# Patient Record
Sex: Female | Born: 1970 | Race: Black or African American | Hispanic: No | Smoking: Current some day smoker
Health system: Southern US, Community
[De-identification: ages and names within clinical notes are randomized; demographics above are authoritative.]

## PROBLEM LIST (undated history)

## (undated) DIAGNOSIS — E785 Hyperlipidemia, unspecified: Secondary | ICD-10-CM

## (undated) DIAGNOSIS — E559 Vitamin D deficiency, unspecified: Secondary | ICD-10-CM

## (undated) DIAGNOSIS — F32A Depression, unspecified: Secondary | ICD-10-CM

## (undated) DIAGNOSIS — F419 Anxiety disorder, unspecified: Secondary | ICD-10-CM

## (undated) DIAGNOSIS — I4510 Unspecified right bundle-branch block: Secondary | ICD-10-CM

## (undated) HISTORY — DX: Unspecified right bundle-branch block: I45.10

## (undated) HISTORY — DX: Hyperlipidemia, unspecified: E78.5

## (undated) HISTORY — DX: Depression, unspecified: F32.A

## (undated) HISTORY — PX: OTHER SURGICAL HISTORY: SHX169

## (undated) HISTORY — DX: Vitamin D deficiency, unspecified: E55.9

## (undated) HISTORY — PX: ABDOMINAL HYSTERECTOMY: SHX81

## (undated) HISTORY — DX: Anxiety disorder, unspecified: F41.9

---

## 2012-10-25 DIAGNOSIS — J302 Other seasonal allergic rhinitis: Secondary | ICD-10-CM | POA: Insufficient documentation

## 2012-10-25 DIAGNOSIS — G4700 Insomnia, unspecified: Secondary | ICD-10-CM | POA: Insufficient documentation

## 2012-10-25 DIAGNOSIS — M2550 Pain in unspecified joint: Secondary | ICD-10-CM | POA: Insufficient documentation

## 2012-10-25 DIAGNOSIS — R0683 Snoring: Secondary | ICD-10-CM

## 2012-10-25 HISTORY — DX: Other seasonal allergic rhinitis: J30.2

## 2012-10-25 HISTORY — DX: Snoring: R06.83

## 2012-10-25 HISTORY — DX: Insomnia, unspecified: G47.00

## 2012-10-25 HISTORY — DX: Pain in unspecified joint: M25.50

## 2012-12-26 DIAGNOSIS — J329 Chronic sinusitis, unspecified: Secondary | ICD-10-CM

## 2012-12-26 HISTORY — DX: Chronic sinusitis, unspecified: J32.9

## 2013-11-19 DIAGNOSIS — K259 Gastric ulcer, unspecified as acute or chronic, without hemorrhage or perforation: Secondary | ICD-10-CM | POA: Insufficient documentation

## 2013-11-19 HISTORY — DX: Gastric ulcer, unspecified as acute or chronic, without hemorrhage or perforation: K25.9

## 2014-02-25 DIAGNOSIS — K635 Polyp of colon: Secondary | ICD-10-CM | POA: Insufficient documentation

## 2014-02-25 HISTORY — DX: Polyp of colon: K63.5

## 2014-05-22 DIAGNOSIS — Z0000 Encounter for general adult medical examination without abnormal findings: Secondary | ICD-10-CM

## 2014-05-22 HISTORY — DX: Encounter for general adult medical examination without abnormal findings: Z00.00

## 2015-07-02 DIAGNOSIS — Z90710 Acquired absence of both cervix and uterus: Secondary | ICD-10-CM | POA: Insufficient documentation

## 2015-07-02 HISTORY — DX: Acquired absence of both cervix and uterus: Z90.710

## 2021-07-07 LAB — HM DEXA SCAN: HM Dexa Scan: NORMAL

## 2021-10-21 LAB — HM HEPATITIS C SCREENING LAB: HM Hepatitis Screen: NEGATIVE

## 2022-01-10 ENCOUNTER — Ambulatory Visit: Payer: Self-pay | Admitting: Family Medicine

## 2022-01-27 LAB — HM MAMMOGRAPHY

## 2022-02-10 ENCOUNTER — Encounter: Payer: Self-pay | Admitting: *Deleted

## 2022-02-10 ENCOUNTER — Ambulatory Visit (INDEPENDENT_AMBULATORY_CARE_PROVIDER_SITE_OTHER): Payer: BC Managed Care – PPO | Admitting: Family Medicine

## 2022-02-10 ENCOUNTER — Ambulatory Visit (HOSPITAL_BASED_OUTPATIENT_CLINIC_OR_DEPARTMENT_OTHER)
Admission: RE | Admit: 2022-02-10 | Discharge: 2022-02-10 | Disposition: A | Discharge status: Home / Self Care | Payer: BC Managed Care – PPO | Source: Ambulatory Visit | Attending: Family Medicine | Admitting: Family Medicine

## 2022-02-10 ENCOUNTER — Other Ambulatory Visit: Payer: Self-pay

## 2022-02-10 ENCOUNTER — Encounter: Payer: Self-pay | Admitting: Family Medicine

## 2022-02-10 VITALS — BP 123/85 | HR 97 | Ht 66.0 in | Wt 207.8 lb

## 2022-02-10 DIAGNOSIS — E669 Obesity, unspecified: Secondary | ICD-10-CM | POA: Diagnosis not present

## 2022-02-10 DIAGNOSIS — M79645 Pain in left finger(s): Secondary | ICD-10-CM | POA: Insufficient documentation

## 2022-02-10 DIAGNOSIS — Z1211 Encounter for screening for malignant neoplasm of colon: Secondary | ICD-10-CM

## 2022-02-10 DIAGNOSIS — R5383 Other fatigue: Secondary | ICD-10-CM

## 2022-02-10 DIAGNOSIS — E782 Mixed hyperlipidemia: Secondary | ICD-10-CM

## 2022-02-10 DIAGNOSIS — R7303 Prediabetes: Secondary | ICD-10-CM

## 2022-02-10 DIAGNOSIS — Z23 Encounter for immunization: Secondary | ICD-10-CM

## 2022-02-10 DIAGNOSIS — Z6833 Body mass index (BMI) 33.0-33.9, adult: Secondary | ICD-10-CM | POA: Diagnosis not present

## 2022-02-10 LAB — HEMOGLOBIN A1C: Hgb A1c MFr Bld: 5.9 % (ref 4.6–6.5)

## 2022-02-10 LAB — COMPREHENSIVE METABOLIC PANEL
ALT: 18 U/L (ref 0–35)
AST: 21 U/L (ref 0–37)
Albumin: 4.2 g/dL (ref 3.5–5.2)
Alkaline Phosphatase: 49 U/L (ref 39–117)
BUN: 12 mg/dL (ref 6–23)
CO2: 34 mEq/L — ABNORMAL HIGH (ref 19–32)
Calcium: 9.7 mg/dL (ref 8.4–10.5)
Chloride: 103 mEq/L (ref 96–112)
Creatinine, Ser: 0.98 mg/dL (ref 0.40–1.20)
GFR: 66.12 mL/min (ref >60.00)
Glucose, Bld: 84 mg/dL (ref 70–99)
Potassium: 4.8 mEq/L (ref 3.5–5.1)
Sodium: 139 mEq/L (ref 135–145)
Total Bilirubin: 0.4 mg/dL (ref 0.2–1.2)
Total Protein: 7 g/dL (ref 6.0–8.3)

## 2022-02-10 LAB — CBC
HCT: 44.6 % (ref 36.0–46.0)
Hemoglobin: 14.7 g/dL (ref 12.0–15.0)
MCHC: 32.9 g/dL (ref 30.0–36.0)
MCV: 95.6 fl (ref 78.0–100.0)
Platelets: 203 10*3/uL (ref 150.0–400.0)
RBC: 4.67 Mil/uL (ref 3.87–5.11)
RDW: 14 % (ref 11.5–15.5)
WBC: 5.4 10*3/uL (ref 4.0–10.5)

## 2022-02-10 LAB — LIPID PANEL
Cholesterol: 225 mg/dL — ABNORMAL HIGH (ref 0–200)
HDL: 64.1 mg/dL (ref >39.00)
LDL Cholesterol: 135 mg/dL — ABNORMAL HIGH (ref 0–99)
NonHDL: 161.3
Total CHOL/HDL Ratio: 4
Triglycerides: 131 mg/dL (ref 0.0–149.0)
VLDL: 26.2 mg/dL (ref 0.0–40.0)

## 2022-02-10 LAB — TSH: TSH: 2.18 u[IU]/mL (ref 0.35–5.50)

## 2022-02-10 NOTE — Patient Instructions (Signed)
Thank you for choosing [facility] for your Primary Care needs. I am excited for the opportunity to partner with you to meet your health care goals. It was a pleasure meeting you today! ° ° °Information on diet, exercise, and health maintenance recommendations are listed below. This is information to help you be sure you are on track for optimal health and monitoring.  ° °Please look over this and let us know if you have any questions or if you have completed any of the health maintenance outside of [facility] so that we can be sure your records are up to date.  °___________________________________________________________ ° °MyChart:  °For all urgent or time sensitive needs we ask that you please call the office to avoid delays. Our number is [phone]. °MyChart is not constantly monitored and due to the large volume of messages a day, replies may take up to 72 business hours. ° °MyChart Policy: °MyChart allows for you to see your visit notes, after visit summary, provider recommendations, lab and tests results, make an appointment, request refills, and contact your provider or the office for non-urgent questions or concerns. Providers are seeing patients during normal business hours and do not have built in time to review MyChart messages.  °We ask that you allow a minimum of 3 business days for responses to MyChart messages. For this reason, please do not send urgent requests through MyChart. Please call the office at [phone]. °New and ongoing conditions may require a visit. We have virtual and in-person visits available for your convenience.  °Complex MyChart concerns may require a visit. Your provider may request you schedule a virtual or in-person visit to ensure we are providing the best care possible. °MyChart messages sent after 11:00 AM on Friday will not be received by the provider until Monday morning.  °  °Lab and Test Results: °You will receive your lab and  test results on MyChart as soon as they are completed and results have been sent by the lab or testing facility. Due to this service, you will receive your results BEFORE your provider.  °I review lab and test results each morning prior to seeing patients. Some results require collaboration with other providers to ensure you are receiving the most appropriate care. For this reason, we ask that you please allow a minimum of 3-5 business days from the time that ALL results have been received for your provider to receive and review lab and test results and contact you about these.  °Most lab and test result comments from the provider will be sent through MyChart. Your provider may recommend changes to the plan of care, follow-up visits, repeat testing, ask questions, or request an office visit to discuss these results. You may reply directly to this message or call the office to provide information for the provider or set up an appointment. °In some instances, you will be called with test results and recommendations. Please let us know if this is preferred and we will make note of this in your chart to provide this for you.    °If you have not heard a response to your lab or test results in 5 business days from all results returning to MyChart, please call the office to let us know. We ask that you please avoid calling prior to this time unless there is an emergent concern. Due to high call volumes, this can delay the resulting process. ° °After Hours: °For all non-emergency after hours needs,   please call the office at [phone] and select the option to reach the on-call  service. On-call services are shared between multiple [place] offices and therefore it will not be possible to speak directly with your provider. On-call providers may provide medical advice and recommendations, but are unable to provide refills for maintenance medications.  °For all emergency or urgent medical needs after normal business  hours, we recommend that you seek care at the closest Urgent Care or Emergency Department to ensure appropriate treatment in a timely manner.  °[facility] has a 24 hour emergency room located on the ground floor for your convenience.  ° °Urgent Concerns During the Business Day °Providers are seeing patients from 8AM to 5PM with a busy schedule and are most often not able to respond to non-urgent calls until the end of the day or the next business day. °If you should have URGENT concerns during the day, please call and speak to the nurse or schedule a same day appointment so that we can address your concern without delay.  ° °Thank you, again, for choosing me as your health care partner. I appreciate your trust and look forward to learning more about you.  ° °Kiyona Mcnall B. Gwendelyn Lanting, DNP, FNP-C ° °___________________________________________________________ ° °Health Maintenance Recommendations °Screening Testing °Mammogram °Every 1-2 years based on history and risk factors °Starting at age 50 °Pap Smear °Ages 21-39 every 3 years °Ages 30-65 every 5 years with HPV testing °More frequent testing may be required based on results and history °Colon Cancer Screening °Every 1-10 years based on test performed, risk factors, and history °Starting at age 45 °Bone Density Screening °Every 2-10 years based on history °Starting at age 65 for women °Recommendations for men differ based on medication usage, history, and risk factors °AAA Screening °One time ultrasound °Men 65-75 years old who have ever smoked °Lung Cancer Screening °Low Dose Lung CT every 12 months °Age 50-80 years with a 20 pack-year smoking history who still smoke or who have quit within the last 15 years ° °Screening Labs °Routine  Labs: Complete Blood Count (CBC), Complete Metabolic Panel (CMP), Cholesterol (Lipid Panel) °Every 6-12 months based on history and medications °May be recommended more frequently based on current conditions or  previous results °Hemoglobin A1c Lab °Every 3-12 months based on history and previous results °Starting at age 45 or earlier with diagnosis of diabetes, high cholesterol, BMI >26, and/or risk factors °Frequent monitoring for patients with diabetes to ensure blood sugar control °Thyroid Panel (TSH w/ T3 & T4) °Every 6 months based on history, symptoms, and risk factors °May be repeated more often if on medication °HIV °One time testing for all patients 13 and older °May be repeated more frequently for patients with increased risk factors or exposure °Hepatitis C °One time testing for all patients 18 and older °May be repeated more frequently for patients with increased risk factors or exposure °Gonorrhea, Chlamydia °Every 12 months for all sexually active persons 13-24 years °Additional monitoring may be recommended for those who are considered high risk or who have symptoms °PSA °Men 40-54 years old with risk factors °Additional screening may be recommended from age 55-69 based on risk factors, symptoms, and history ° °Vaccine Recommendations °Tetanus Booster °All adults every 10 years °Flu Vaccine °All patients 6 months and older every year °COVID Vaccine °All patients 12 years and older °Initial dosing with booster °May recommend additional booster based on age and health history °HPV Vaccine °2 doses all patients   age 9-26 °Dosing may be considered for patients over 26 °Shingles Vaccine (Shingrix) °2 doses all adults 50 years and older °Pneumonia (Pneumovax 23) °All adults 65 years and older °May recommend earlier dosing based on health history °Pneumonia (Prevnar 13) °All adults 65 years and older °Dosed 1 year after Pneumovax 23 °Pneumonia (Prevnar 20) °All adults 65 years and older (adults 19-64 with certain conditions or risk factors) °1 dose  °For those who have no received Prevnar 13 vaccine previously ° ° °Additional Screening, Testing, and Vaccinations may be recommended on an individualized basis based on  family history, health history, risk factors, and/or exposure.  °__________________________________________________________ ° °Diet Recommendations for All Patients ° °I recommend that all patients maintain a diet low in saturated fats, carbohydrates, and cholesterol. While this can be challenging at first, it is not impossible and small changes can make big differences.  °Things to try: °Decreasing the amount of soda, sweet tea, and/or juice to one or less per day and replace with water °While water is always the first choice, if you do not like water you may consider °adding a water additive without sugar to improve the taste °other sugar free drinks °Replace potatoes with a brightly colored vegetable at dinner °Use healthy oils, such as canola oil or olive oil, instead of butter or hard margarine °Limit your bread intake to two pieces or less a day °Replace regular pasta with low carb pasta options °Bake, broil, or grill foods instead of frying °Monitor portion sizes  °Eat smaller, more frequent meals throughout the day instead of large meals ° °An important thing to remember is, if you love foods that are not great for your health, you don't have to give them up completely. Instead, allow these foods to be a reward when you have done well. Allowing yourself to still have special treats every once in a while is a nice way to tell yourself thank you for working hard to keep yourself healthy.  ° °Also remember that every day is a new day. If you have a bad day and "fall off the wagon", you can still climb right back up and keep moving along on your journey! ° °We have resources available to help you!  °Some websites that may be helpful include: °www.MyPlate.gov  °Www.VeryWellFit.com °_____________________________________________________________ ° °Activity Recommendations for All Patients ° °I recommend that all adults get at least 20 minutes of moderate physical activity that elevates your heart rate at least 5  days out of the week.  °Some examples include: °Walking or jogging at a pace that allows you to carry on a conversation °Cycling (stationary bike or outdoors) °Water aerobics °Yoga °Weight lifting °Dancing °If physical limitations prevent you from putting stress on your joints, exercise in a pool or seated in a chair are excellent options. ° °Do determine your MAXIMUM heart rate for activity: YOUR AGE - 220 = MAX HeartRate  ° °Remember! °Do not push yourself too hard.  °Start slowly and build up your pace, speed, weight, time in exercise, etc.  °Allow your body to rest between exercise and get good sleep. °You will need more water than normal when you are exerting yourself. Do not wait until you are thirsty to drink. Drink with a purpose of getting in at least 8, 8 ounce glasses of water a day plus more depending on how much you exercise and sweat.  ° ° °If you begin to develop dizziness, chest pain, abdominal pain, jaw pain, shortness of breath, headache, vision   changes, lightheadedness, or other concerning symptoms, stop the activity and allow your body to rest. If your symptoms are severe, seek emergency evaluation immediately. If your symptoms are concerning, but not severe, please let us know so that we can recommend further evaluation.  ° ° ° °

## 2022-02-10 NOTE — Progress Notes (Signed)
______________________________________________________________________  HPI Colleen Glass is a 51 y.o. female presenting to [facility] at [facility] today to establish care.   Patient Care Team: Terrilyn Saver, NP as PCP - General (Family Medicine)  Health Maintenance  Topic Date Due   HIV Screening  Never done   Hepatitis C Screening: USPSTF Recommendation to screen - Ages 89-79 yo.  Never done   Colon Cancer Screening  Never done   Mammogram  Never done   COVID-19 Vaccine (3 - Booster for Moderna series) 03/18/2020   Flu Shot  03/17/2022*   Zoster (Shingles) Vaccine (2 of 2) 04/07/2022   Tetanus Vaccine  01/15/2025   HPV Vaccine  Aged Out   Pap Smear  Discontinued  *Topic was postponed. The date shown is not the original due date.     Concerns today: Insomnia/fatigue - has trouble staying asleep, sleep study at [facility] but never got results; snores, does not feel well rested, often tired during the day, never told she is apneic at night, props up on pillows for comfort, occasionally wakes up coughing L thumb pain  - about a month and a half ago, woke up and it was hurting at base of thumb, feels like an injury, doesn't recall an injury , maybe mild swelling, any range of motion is painful, probably 4-6 weeks of symptoms, grip is impaired d/t pain 5/10 consistently, Aleve helping, no known history of arthritis  HLD/RBBB - simvastatin 10 mg daily; Omega-3 daily Weight - strict about diet, regular exercise (walking 3x/week), trouble losing weight ever since menopause. Would like to consider weight loss meds pending lab results   There are no problems to display for this patient.   PHQ9 Today: No flowsheet data found. GAD7 Today: No flowsheet data found. ______________________________________________________________________ PMH Past Medical History:  Diagnosis Date   Hyperlipidemia    Right bundle branch block     ROS All review of systems  negative except what is listed in the HPI  PHYSICAL EXAM Physical Exam Vitals reviewed.  Constitutional:      Appearance: Normal appearance. She is obese.  HENT:     Head: Normocephalic and atraumatic.  Cardiovascular:     Rate and Rhythm: Normal rate and regular rhythm.     Pulses: Normal pulses.     Heart sounds: Normal heart sounds.  Pulmonary:     Effort: Pulmonary effort is normal.     Breath sounds: Normal breath sounds.  Musculoskeletal:     Cervical back: Normal range of motion and neck supple. No tenderness.     Comments: Left thumb pain (MCP to CMP) with palpation and range of motion, no discoloration, warmth, rashes, lesions, significant edema  Lymphadenopathy:     Cervical: No cervical adenopathy.  Skin:    Capillary Refill: Capillary refill takes less than 2 seconds.  Neurological:     General: No focal deficit present.     Mental Status: She is alert and oriented to person, place, and time. Mental status is at baseline.  Psychiatric:        Mood and Affect: Mood normal.        Behavior: Behavior normal.        Thought Content: Thought content normal.        Judgment: Judgment normal.   ______________________________________________________________________ ASSESSMENT AND PLAN  1. Class 1 obesity without serious comorbidity with body mass index (BMI) of 33.0 to 33.9 in adult, unspecified obesity type Interested in weight loss options. Starting  with labs today. Then we discuss options based on results. She has been working hard and eating healthy and getting regular physical activity.  - CBC - Comprehensive metabolic panel - Lipid panel - TSH - Hemoglobin A1c  2. Mixed hyperlipidemia Taking simvastatin and Omega-3. Checking labs today (fasting) - Lipid panel  3. Fatigue, unspecified type Requesting records for sleep study. Labs today to rule out other causes. Discussed sleep hygiene. Consider sleep aids if workups negative.  - CBC - Comprehensive metabolic  panel - TSH  4. Screen for colon cancer - Ambulatory referral to Gastroenterology  5. Pain of left thumb Xray today given duration of symptoms For now, take Aleve bid for 2 weeks then PRN, ice, elevation, compression (sleeve/brace), gentle range of motion exercises.  Consider PT and or sports med if not improving - DG Finger Thumb Left; Future  6. Need for shingles vaccine - Varicella-zoster vaccine IM (Shingrix)  Establish care  Education provided today during visit and on AVS for patient to review at home.  Diet and Exercise recommendations provided.  Current diagnoses and recommendations discussed. HM recommendations reviewed with recommendations.    Outpatient Encounter Medications as of 02/10/2022  Medication Sig   Omega-3 Fatty Acids (FISH OIL) 1000 MG CAPS Take 1 capsule by mouth daily.   simvastatin (ZOCOR) 10 MG tablet Take 10 mg by mouth at bedtime.   Vitamin D, Ergocalciferol, (DRISDOL) 1.25 MG (50000 UNIT) CAPS capsule Take 50,000 Units by mouth once a week.   No facility-administered encounter medications on file as of 02/10/2022.    Return in about 6 months (around 08/10/2022).    Purcell Nails Olevia Bowens, DNP, FNP-C

## 2022-02-13 ENCOUNTER — Telehealth: Payer: Self-pay

## 2022-02-13 MED ORDER — WEGOVY 0.25 MG/0.5ML ~~LOC~~ SOAJ: 0.2500 mg | SUBCUTANEOUS | 0 refills | Status: DC

## 2022-02-13 NOTE — Addendum Note (Signed)
Addended by: Hyman Hopes B on: 02/13/2022 08:15 AM   Modules accepted: Orders

## 2022-02-13 NOTE — Telephone Encounter (Signed)
PA approved. Effective 02/13/2022 - 09/13/2022

## 2022-02-13 NOTE — Telephone Encounter (Signed)
PA initiated via Covermymeds; KEY: [id number]. Awaiting determination.

## 2022-02-26 ENCOUNTER — Encounter: Payer: Self-pay | Admitting: Family Medicine

## 2022-03-03 ENCOUNTER — Telehealth (INDEPENDENT_AMBULATORY_CARE_PROVIDER_SITE_OTHER): Payer: BC Managed Care – PPO | Admitting: Family Medicine

## 2022-03-03 ENCOUNTER — Telehealth: Payer: Self-pay | Admitting: Family Medicine

## 2022-03-03 ENCOUNTER — Encounter: Payer: Self-pay | Admitting: *Deleted

## 2022-03-03 ENCOUNTER — Encounter: Payer: Self-pay | Admitting: Family Medicine

## 2022-03-03 VITALS — Wt 206.0 lb

## 2022-03-03 DIAGNOSIS — G4733 Obstructive sleep apnea (adult) (pediatric): Secondary | ICD-10-CM

## 2022-03-03 DIAGNOSIS — F419 Anxiety disorder, unspecified: Secondary | ICD-10-CM

## 2022-03-03 DIAGNOSIS — F32A Depression, unspecified: Secondary | ICD-10-CM

## 2022-03-03 HISTORY — DX: Obstructive sleep apnea (adult) (pediatric): G47.33

## 2022-03-03 MED ORDER — VENLAFAXINE HCL ER 37.5 MG PO CP24
37.5000 mg | ORAL_CAPSULE | Freq: Every day | ORAL | 1 refills | Status: DC
Start: 2022-03-03 — End: 2022-03-27

## 2022-03-03 NOTE — Telephone Encounter (Signed)
Records received from [facility] regarding Home Sleep Study from 10/14/21. Copy of results will be sent to scanning to add to chart.  ? ?Results indicated overall AHI of 14.9 suggesting mild sleep apnea. Average O2 sat was 94% with lowest sat at 88%. Mean heart rate was 80.6 bpm. Will place referral to pulmonology so patient can discuss treatment options. ?

## 2022-03-03 NOTE — Progress Notes (Signed)
Virtual Video Visit via MyChart Note ? ?I connected with  Colleen Glass on 03/03/22 at 10:20 AM EDT by the video enabled telemedicine application for MyChart, and verified that I am speaking with the correct person using two identifiers. ?  ?I introduced myself as a Publishing rights manager with the practice. We discussed the limitations of evaluation and management by telemedicine and the availability of in person appointments. The patient expressed understanding and agreed to proceed. ? ?Participating parties in this visit include: The patient and the nurse practitioner listed.  ?The patient is: At home ?I am: In the office - [facility] ? ?Subjective:   ? ?CC: depression  ? ? ?HPI: Colleen Glass is a 51 y.o. year old female presenting today via MyChart today for depression. ? ?Patient states she forgot to mention at last appointment that she is struggled with depression in the past.  States many years ago she was on Paxil temporarily after having a stillbirth.  She tolerated it okay but felt it made her feel in different.  Later on she ended up trying Wellbutrin which she stayed on for 2 to 3 years as it worked well at first but then she was not noticing any improvement even after changing doses.  She was then switched to Lexapro which made her feel even more sad/emotional.  She stopped the Lexapro a few months ago.  Reports her mother also has depression but she is unsure of what she takes.  Patient declines any recent, recurring panic/anxiety attacks.  No SI/HI.  She would like to try a new medication and go to counseling. ? ?At last visit we briefly discussed her trouble sleeping.  States she has not gotten relief with trazodone or Ambien.  She usually falls asleep okay but wakes up a few hours into the night and then has trouble going back to sleep.  She had a sleep study done at [facility] recently and we have requested records.  Will be on the look out for these before making  any further decisions. ? ? ? ?PHQ9 SCORE ONLY 03/03/2022  ?PHQ-9 Total Score 12  ? ? ?GAD 7 : Generalized Anxiety Score 03/03/2022  ?Nervous, Anxious, on Edge 1  ?Control/stop worrying 3  ?Worry too much - different things 3  ?Trouble relaxing 1  ?Restless 0  ?Easily annoyed or irritable 3  ?Afraid - awful might happen 1  ?Total GAD 7 Score 12  ?Anxiety Difficulty Very difficult  ? ? ? ? ?Past medical history, Surgical history, Family history not pertinant except as noted below, Social history, Allergies, and medications have been entered into the medical record, reviewed, and corrections made.  ? ?Review of Systems:  ?All review of systems negative except what is listed in the HPI ? ? ?Objective:   ? ?General:  ?Speaking clearly in complete sentences. ?Absent shortness of breath noted.   ?Alert and oriented x3.   ?Normal judgment.  ?Absent acute distress. ? ? ?Impression and Recommendations:   ? ?1. Anxiety and depression ?- venlafaxine XR (EFFEXOR XR) 37.5 MG 24 hr capsule; Take 1 capsule (37.5 mg total) by mouth daily with breakfast.  Dispense: 30 capsule; Refill: 1 ?- Ambulatory referral to Behavioral Health ? ?Discussed options with patient.  She would like to try Effexor as she has heard good things about this medication.  Education discussed and provided via AVS on MyChart.  No SI/HI.  Recommend follow-up in the next 4 to 6 weeks.  Referral placed  for counseling. ? ? ?Follow-up in 4-6 weeks; sooner if symptoms worsen or fail to improve.  ?  ?I discussed the assessment and treatment plan with the patient. The patient was provided an opportunity to ask questions and all were answered. The patient agreed with the plan and demonstrated an understanding of the instructions. ?  ?The patient was advised to call back or seek an in-person evaluation if the symptoms worsen or if the condition fails to improve as anticipated. ? ?I spent 20 minutes dedicated to the care of this patient on the date of this encounter to  include pre-visit chart review of prior notes and results, face-to-face time with the patient, and post-visit ordering of testing as indicated.  ? ?Clayborne Dana, NP  ? ?

## 2022-03-03 NOTE — Telephone Encounter (Signed)
Sent mychart message to pt. 

## 2022-03-03 NOTE — Patient Instructions (Signed)
Taking the medicine as directed and not missing any doses is one of the best things you can do to treat your anxiety/depression.  Here are some things to keep in mind: Side effects (stomach upset, some increased anxiety) may happen before you notice a benefit.  These side effects typically go away over time. Changes to your dose of medicine or a change in medication all together is sometimes necessary Many people will notice an improvement within two weeks but the full effect of the medication can take up to 4-6 weeks Stopping the medication when you start feeling better often results in a return of symptoms. Most people need to be on medication at least 6-12 months If you start having thoughts of hurting yourself or others after starting this medicine, please call me immediately.    

## 2022-03-26 ENCOUNTER — Encounter: Payer: Self-pay | Admitting: Family Medicine

## 2022-03-26 ENCOUNTER — Other Ambulatory Visit: Payer: Self-pay | Admitting: Family Medicine

## 2022-03-26 DIAGNOSIS — F419 Anxiety disorder, unspecified: Secondary | ICD-10-CM

## 2022-03-27 ENCOUNTER — Encounter: Payer: Self-pay | Admitting: Family Medicine

## 2022-03-27 ENCOUNTER — Other Ambulatory Visit: Payer: Self-pay | Admitting: Family Medicine

## 2022-03-27 ENCOUNTER — Other Ambulatory Visit: Payer: Self-pay

## 2022-03-27 MED ORDER — VITAMIN D (ERGOCALCIFEROL) 1.25 MG (50000 UNIT) PO CAPS
50000.0000 [IU] | ORAL_CAPSULE | ORAL | 0 refills | Status: DC
  Filled 2022-03-27: qty 5, 35d supply, fill #0

## 2022-03-28 ENCOUNTER — Other Ambulatory Visit: Payer: Self-pay

## 2022-03-30 ENCOUNTER — Encounter: Payer: Self-pay | Admitting: Family Medicine

## 2022-03-31 ENCOUNTER — Ambulatory Visit (INDEPENDENT_AMBULATORY_CARE_PROVIDER_SITE_OTHER): Payer: BC Managed Care – PPO | Admitting: Psychology

## 2022-03-31 ENCOUNTER — Other Ambulatory Visit: Payer: Self-pay

## 2022-03-31 ENCOUNTER — Encounter: Payer: Self-pay | Admitting: Family Medicine

## 2022-03-31 DIAGNOSIS — F4321 Adjustment disorder with depressed mood: Secondary | ICD-10-CM | POA: Diagnosis not present

## 2022-03-31 MED ORDER — VITAMIN D (ERGOCALCIFEROL) 1.25 MG (50000 UNIT) PO CAPS: 50000.0000 [IU] | ORAL_CAPSULE | ORAL | 3 refills | Status: DC
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
## 2022-04-06 ENCOUNTER — Other Ambulatory Visit: Payer: Self-pay

## 2022-04-06 ENCOUNTER — Encounter: Payer: Self-pay | Admitting: Family Medicine

## 2022-04-06 DIAGNOSIS — R7303 Prediabetes: Secondary | ICD-10-CM

## 2022-04-06 DIAGNOSIS — E669 Obesity, unspecified: Secondary | ICD-10-CM

## 2022-04-06 MED ORDER — WEGOVY 0.25 MG/0.5ML ~~LOC~~ SOAJ: 0.2500 mg | SUBCUTANEOUS | 0 refills | Status: DC

## 2022-04-06 MED ORDER — VITAMIN D (ERGOCALCIFEROL) 1.25 MG (50000 UNIT) PO CAPS: 50000.0000 [IU] | ORAL_CAPSULE | ORAL | 3 refills | Status: DC

## 2022-04-14 ENCOUNTER — Encounter: Payer: Self-pay | Admitting: Family Medicine

## 2022-04-14 ENCOUNTER — Ambulatory Visit (INDEPENDENT_AMBULATORY_CARE_PROVIDER_SITE_OTHER): Payer: BC Managed Care – PPO | Admitting: Psychology

## 2022-04-14 DIAGNOSIS — F4321 Adjustment disorder with depressed mood: Secondary | ICD-10-CM | POA: Diagnosis not present

## 2022-04-14 NOTE — Progress Notes (Signed)
[facility] Counselor/Therapist Progress Note ? ?Patient ID: Colleen Glass, MRN: [id number],   ? ?Date: 04/14/2022 ? ?Time Spent: 45 mins ? ?Treatment Type: Individual Therapy ? ?Reported Symptoms: Pt presents for session, via telephone, due to webex issues.  Pt grants consent for the session, stating that she is in her home with no one else present.  I shared with pt that I am in my office at home and no one is here with me either.   ? ?Mental Status Exam: ?Appearance:  Casual     ?Behavior: Appropriate  ?Motor: Normal  ?Speech/Language:  Clear and Coherent  ?Affect: Congruent  ?Mood: normal  ?Thought process: normal  ?Thought content:   WNL  ?Sensory/Perceptual disturbances:   WNL  ?Orientation: oriented to person, place, and time/date  ?Attention: Good  ?Concentration: Good  ?Memory: WNL  ?Fund of knowledge:  Good  ?Insight:   Good  ?Judgment:  Good  ?Impulse Control: Good  ? ?Risk Assessment: ?Danger to Self:  No ?Self-injurious Behavior: No ?Danger to Others: No ?Duty to Warn:no ?Physical Aggression / Violence:No  ?Access to Firearms a concern: No  ?Gang Involvement:No  ? ?Subjective: Talked with pt about the benefits of self care activities; she has not been very good about being consistent with focusing doing what is good for her.  Pt shares she also has some issues with ruminating thoughts and not being able to focus of her work or self care activities (concerns for her daughter, etc).  Pt is often concerned about Adelina Mings and these thoughts often are the focus of her ruminations.  Encouraged pt to talk with Adelina Mings about how they can come up with a plan that works for both of them to stay in contact at a level that is helpful for each of them.  Also encouraged pt to think about when she wants to engage in her self care activities and what she wants them to be.  Also suggested pt contact her PCP about a possible increase in the dosage of her antidepressant for greater impact.  We will talk  more about these topics in our follow up session in 2 wks. ? ?Interventions: Cognitive Behavioral Therapy ? ?Diagnosis:Adjustment disorder with depressed mood ? ?Plan: Treatment Plan ?Strengths/Abilities:  Intelligent, Intuitive, Willing to participate in therapy ?Treatment Preferences:  Outpatient Individual Therapy ?Statement of Needs:  Patient is to use CBT, mindfulness and coping skills to help manage and/or decrease symptoms associated with their diagnosis. ?Symptoms:  Depressed/Irritable mood, worry, social withdrawal ?Problems Addressed:  Depressive thoughts, Sadness, Sleep issues, etc. ?Long Term Goals:  Pt to reduce overall level, frequency, and intensity of the feelings of depression as evidenced by decreased irritability, negative self talk, and helpless feelings from 6 to 7 days/week to 0 to 1 days/week, per client report, for at least 3 consecutive months.  Progress: 10% ?Short Term Goals:  Pt to verbally express understanding of the relationship between feelings of depression and their impact on thinking patterns and behaviors.  Pt to verbalize an understanding of the role that distorted thinking plays in creating fears, excessive worry, and ruminations.  Progress: 10% ?Target Date:  04/15/2023 ?Frequency:  Bi-weekly ?Modality:  Cognitive Behavioral Therapy ?Interventions by Therapist:  Therapist will use CBT, Mindfulness exercises, Coping skills and Referrals, as needed by client. ?Client has verbally approved this treatment plan. ? ?Karie Kirks, [facility] ?

## 2022-04-21 ENCOUNTER — Ambulatory Visit (INDEPENDENT_AMBULATORY_CARE_PROVIDER_SITE_OTHER): Payer: BC Managed Care – PPO | Admitting: Acute Care

## 2022-04-21 ENCOUNTER — Encounter: Payer: Self-pay | Admitting: Acute Care

## 2022-04-21 VITALS — BP 120/78 | HR 96 | Temp 98.4°F | Ht 66.0 in | Wt 205.6 lb

## 2022-04-21 DIAGNOSIS — G4733 Obstructive sleep apnea (adult) (pediatric): Secondary | ICD-10-CM

## 2022-04-21 NOTE — Progress Notes (Signed)
? ?History of Present Illness ?Colleen Glass is a 51 y.o. female with PMH of Hyperlipidemia, and partial hysterectomy in 2009. She presents for suspected OSA  for sleep consult. She had a recent sleep study at [facility] which confirms mild OSA,. Referred by Colleen Hopes NP. ? ? ?04/21/2022 ?Pt. Presents for evaluation of sleep apnea. She states she has had Weight gain since menopause. She has daytime sleepiness, morning headaches, night time awakenings. She is frustrated by how difficult it has been to lose weight. She knows this is an element of her apnea.  ? ?Test Results: ?10/14/2021 Home Sleep Study ?AHI of 14.9 suggesting mild sleep apnea.  ?Average O2 sat was 94% with lowest sat at 88%.  ?Mean heart rate was 80.6 bpm. ? ?Sleep questionnaire ?Symptoms-  Patient has sleep apnea, no current symptoms  ?Prior sleep study-  Yes, 10/2021 ?Bedtime- 10-11 pm ?Time to fall asleep- 1 hour ?Nocturnal awakenings- 2-3 ?Out of bed/start of day- 6 am ?Weight changes- increased since menopause ?Do you operate heavy machinery- No ?Do you currently wear CPAP- No ?Do you current wear oxygen- No ?Epworth-  5 ?Moderate Chance of dozing sitting and reading/ watching TV( 4) ?Slight chance of dosing as passenger in car for an hour without a break ( 1)  ? ? ?  Latest Ref Rng & Units 02/10/2022  ? 10:41 AM  ?CBC  ?WBC 4.0 - 10.5 K/uL 5.4    ?Hemoglobin 12.0 - 15.0 g/dL 74.2    ?Hematocrit 36.0 - 46.0 % 44.6    ?Platelets 150.0 - 400.0 K/uL 203.0    ? ? ? ?  Latest Ref Rng & Units 02/10/2022  ? 10:41 AM  ?BMP  ?Glucose 70 - 99 mg/dL 84    ?BUN 6 - 23 mg/dL 12    ?Creatinine 0.40 - 1.20 mg/dL 5.95    ?Sodium 135 - 145 mEq/L 139    ?Potassium 3.5 - 5.1 mEq/L 4.8    ?Chloride 96 - 112 mEq/L 103    ?CO2 19 - 32 mEq/L 34    ?Calcium 8.4 - 10.5 mg/dL 9.7    ? ? ?BNP ?No results found for: BNP ? ?ProBNP ?No results found for: PROBNP ? ?PFT ?No results found for: FEV1PRE, FEV1POST, FVCPRE, FVCPOST, TLC, DLCOUNC, PREFEV1FVCRT,  PSTFEV1FVCRT ? ?No results found. ? ? ?Past medical hx ?Past Medical History:  ?Diagnosis Date  ? Anxiety and depression   ? Hyperlipidemia   ? Right bundle branch block   ? Vitamin D deficiency   ?  ? ?Social History  ? ?Tobacco Use  ? Smoking status: Some Days  ?  Types: Cigarettes  ? Smokeless tobacco: Never  ?Vaping Use  ? Vaping Use: Never used  ?Substance Use Topics  ? Alcohol use: Yes  ? Drug use: Yes  ?  Types: Other-see comments  ?  Comment: CBD gummies to help with sleep  ? ? ?Colleen Glass reports that she has been smoking cigarettes. She has never used smokeless tobacco. She reports current alcohol use. She reports current drug use. Drug: Other-see comments. ? ?Tobacco Cessation: ?Current every day smoker ? ?Past surgical hx, Family hx, Social hx all reviewed. ? ?Current Outpatient Medications on File Prior to Visit  ?Medication Sig  ? Omega-3 Fatty Acids (FISH OIL) 1000 MG CAPS Take 1 capsule by mouth daily.  ? Semaglutide-Weight Management (WEGOVY) 0.25 MG/0.5ML SOAJ Inject 0.25 mg into the skin once a week.  ? simvastatin (ZOCOR) 10 MG tablet Take 10 mg by  mouth at bedtime.  ? venlafaxine XR (EFFEXOR-XR) 37.5 MG 24 hr capsule TAKE 1 CAPSULE BY MOUTH DAILY WITH BREAKFAST.  ? Vitamin D, Ergocalciferol, (DRISDOL) 1.25 MG (50000 UNIT) CAPS capsule Take 1 capsule (50,000 Units total) by mouth once a week. Schedule labs before next refill.  ? ?No current facility-administered medications on file prior to visit.  ?  ? ?Allergies  ?Allergen Reactions  ? Penicillins Rash  ? Sulfa Antibiotics Hives and Rash  ? ? ?Review Of Systems: ? ?Constitutional:   No  weight loss, night sweats,  Fevers, chills, fatigue, or  lassitude. ? ?HEENT:   No headaches,  Difficulty swallowing,  Tooth/dental problems, or  Sore throat,  ?              No sneezing, itching, ear ache, nasal congestion, post nasal drip,  ? ?CV:  No chest pain,  Orthopnea, PND, swelling in lower extremities, anasarca, dizziness, palpitations, syncope.   ? ?GI  No heartburn, indigestion, abdominal pain, nausea, vomiting, diarrhea, change in bowel habits, loss of appetite, bloody stools.  ? ?Resp: No shortness of breath with exertion or at rest.  No excess mucus, no productive cough,  No non-productive cough,  No coughing up of blood.  No change in color of mucus.  No wheezing.  No chest wall deformity ? ?Skin: no rash or lesions. ? ?GU: no dysuria, change in color of urine, no urgency or frequency.  No flank pain, no hematuria  ? ?MS:  No joint pain or swelling.  No decreased range of motion.  No back pain. ? ?Psych:  No change in mood or affect. No depression or anxiety.  No memory loss. ? ? ?Vital Signs ?BP 120/78 (BP Location: Left Arm, Patient Position: Sitting, Cuff Size: Normal)   Pulse 96   Temp 98.4 ?F (36.9 ?C) (Oral)   Ht 5\' 6"  (1.676 m)   Wt 205 lb 9.6 oz (93.3 kg)   SpO2 100%   BMI 33.18 kg/m?  ? ? ?Physical Exam: ? ?General- No distress,  A&Ox3, pleasant  ?ENT: No sinus tenderness, TM clear, pale nasal mucosa, no oral exudate,no post nasal drip, no LAN ?Cardiac: S1, S2, regular rate and rhythm, no murmur ?Chest: No wheeze/ rales/ dullness; no accessory muscle use, no nasal flaring, no sternal retractions ?Abd.: Soft Non-tender, ND, BS +, Body mass index is 33.18 kg/m?. ?Ext: No clubbing cyanosis, edema ?Neuro:  normal strength, MAE x 4, A&O x 3 ?Skin: No rashes, warm and dry, No lesions  ?Psych: normal mood and behavior ? ? ?Assessment/Plan ?Mild OSA per Sleep Study 10/2021 ?Plan ?We will place orders for a CPAP machine. ?Start pressures Auto set 5-15 cm H2O ?Dream ware nasal pillow ?Consider body pillow to help maintain side lying position ?Continue on CPAP at bedtime. You appear to be benefiting from the treatment  ?Goal is to wear for at least 6 hours each night for maximal clinical benefit. ?Continue to work on weight loss, as the link between excess weight  and sleep apnea is well established.   ?Remember to establish a good bedtime routine,  and work on sleep hygiene.  ?Limit daytime naps , avoid stimulants such as caffeine and nicotine close to bedtime, exercise daily to promote sleep quality, avoid heavy , spicy, fried , or rich foods before bed. Ensure adequate exposure to natural light during the day,establish a relaxing bedtime routine with a pleasant sleep environment ( Bedroom between 60 and 67 degrees, turn off bright lights , TV  or device screens screens , consider black out curtains or white noise machines) ?Do not drive if sleepy. ?Remember to clean mask, tubing, filter, and reservoir once weekly with soapy water.  ?Follow up with  Piercen Covino NP  In  8 weeks  ?We will check a down Load    ?Please contact office for sooner follow up if symptoms do not improve or worsen or seek emergency care   ? ? ? ?I spent 30 minutes dedicated to the care of this patient on the date of this encounter to include pre-visit review of records, face-to-face time with the patient discussing conditions above, post visit ordering of testing, clinical documentation with the electronic health record, making appropriate referrals as documented, and communicating necessary information to the patient's healthcare team.  ? ?Bevelyn NgoSarah F Kiam Bransfield, NP ?04/21/2022  5:11 PM ? ? ? ? ? ? ? ? ? ?

## 2022-04-21 NOTE — Patient Instructions (Addendum)
It is good to see you today ?We will place orders for a CPAP machine. ?Start pressures Auto set 5-15 cm H2O ?Dream ware nasal pillow ?Consider body pillow to help maintain side lying position ?Continue on CPAP at bedtime. You appear to be benefiting from the treatment  ?Goal is to wear for at least 6 hours each night for maximal clinical benefit. ?Continue to work on weight loss, as the link between excess weight  and sleep apnea is well established.   ?Remember to establish a good bedtime routine, and work on sleep hygiene.  ?Limit daytime naps , avoid stimulants such as caffeine and nicotine close to bedtime, exercise daily to promote sleep quality, avoid heavy , spicy, fried , or rich foods before bed. Ensure adequate exposure to natural light during the day,establish a relaxing bedtime routine with a pleasant sleep environment ( Bedroom between 60 and 67 degrees, turn off bright lights , TV or device screens screens , consider black out curtains or white noise machines) ?Do not drive if sleepy. ?Remember to clean mask, tubing, filter, and reservoir once weekly with soapy water.  ?Follow up with  Colleen Mcbeth NP  In  8 weeks  ?We will check a down Load    ?Please contact office for sooner follow up if symptoms do not improve or worsen or seek emergency care   ? ?

## 2022-04-24 NOTE — Progress Notes (Signed)
Reviewed and agree with assessment/plan. ? ? ?Kamyra Schroeck, MD ?[facility] ?04/24/2022, 8:28 AM ?Pager:  [phone] ? ?

## 2022-04-28 ENCOUNTER — Ambulatory Visit: Payer: BC Managed Care – PPO | Admitting: Psychology

## 2022-05-04 ENCOUNTER — Encounter: Payer: Self-pay | Admitting: Family Medicine

## 2022-05-18 ENCOUNTER — Telehealth: Payer: Self-pay | Admitting: Gastroenterology

## 2022-05-18 NOTE — Telephone Encounter (Signed)
Good Afternoon Dr. Silverio Decamp,   We have received records and referral for patient to have a colonoscopy. After looking at her records patient had her last procedure done with [facility] in 2020. I will be sending records for you to review, will you please review and advise on scheduling?  Thank you.

## 2022-06-02 ENCOUNTER — Encounter: Payer: Self-pay | Admitting: Family Medicine

## 2022-06-02 NOTE — Telephone Encounter (Signed)
Per Dr Lavon Paganini Colonoscopy recall will be 08/2022 Records sent to be scanned in

## 2022-06-16 ENCOUNTER — Encounter: Payer: Self-pay | Admitting: Acute Care

## 2022-06-16 ENCOUNTER — Ambulatory Visit (INDEPENDENT_AMBULATORY_CARE_PROVIDER_SITE_OTHER): Payer: BC Managed Care – PPO | Admitting: Acute Care

## 2022-06-16 ENCOUNTER — Telehealth: Payer: Self-pay | Admitting: *Deleted

## 2022-06-16 VITALS — BP 106/80 | HR 83 | Temp 98.4°F | Ht 66.0 in | Wt 203.0 lb

## 2022-06-16 DIAGNOSIS — F17210 Nicotine dependence, cigarettes, uncomplicated: Secondary | ICD-10-CM | POA: Diagnosis not present

## 2022-06-16 DIAGNOSIS — G4733 Obstructive sleep apnea (adult) (pediatric): Secondary | ICD-10-CM | POA: Diagnosis not present

## 2022-06-16 DIAGNOSIS — Z9989 Dependence on other enabling machines and devices: Secondary | ICD-10-CM

## 2022-06-16 DIAGNOSIS — F17200 Nicotine dependence, unspecified, uncomplicated: Secondary | ICD-10-CM

## 2022-06-16 NOTE — Telephone Encounter (Signed)
Corrie Dandy states can see patient's download now. Mary phone number is [phone].

## 2022-06-16 NOTE — Progress Notes (Signed)
History of Present Illness Colleen Glass is a 51 y.o. female with PMH of Hyperlipidemia, and partial hysterectomy in 2009. She presents for suspected OSA  for sleep consult. She had a recent sleep study at [facility] which confirms mild OSA.She started treatment with CPAP for her OSA in 03/2022.    06/16/2022 Pt. Was seen 04/21/2022 for a sleep consult. She had had a sleep study that confirmed Mild sleep apnea with AHI of 14.9. She was symptomatic for daytime sleepiness, morning headaches, night time awakenings.She does use CBD gummies to help her with sleep.  We reviewed options for treatment, and she decided on CPAP therapy with nasal pillow. She is here for follow up after getting her CPAP machine to see how she is doing with new therapy initiation. . Initial mode and pressures are Auto Set 5-15 cm H2O. Down Load reveals   Pt. States since she has been wearing her CPAP machine she has felt much better. She has been using he CPAP machine for about 3 months. She does not have night time awakenings. She has much less sleepy during the day. She no longer has morning headaches. She is sleeping 5-6 hours   Test Results: Down Load AutoSet 5-15 cm pressure Usage 30/30 days Average usage 8 hours 36 minutes > 4 hours 100% of the time AHI 0.9  Average Pressure 6.1 mm H2O Minimal leaks  10/14/2021 Home Sleep Study AHI of 14.9 suggesting mild sleep apnea.  Average O2 sat was 94% with lowest sat at 88%.  Mean heart rate was 80.6 bpm.     Latest Ref Rng & Units 02/10/2022   10:41 AM  CBC  WBC 4.0 - 10.5 K/uL 5.4   Hemoglobin 12.0 - 15.0 g/dL 70.3   Hematocrit 50.0 - 46.0 % 44.6   Platelets 150.0 - 400.0 K/uL 203.0        Latest Ref Rng & Units 02/10/2022   10:41 AM  BMP  Glucose 70 - 99 mg/dL 84   BUN 6 - 23 mg/dL 12   Creatinine 9.38 - 1.20 mg/dL 1.82   Sodium 993 - 716 mEq/L 139   Potassium 3.5 - 5.1 mEq/L 4.8   Chloride 96 - 112 mEq/L 103   CO2 19 - 32 mEq/L 34   Calcium 8.4  - 10.5 mg/dL 9.7     BNP No results found for: "BNP"  ProBNP No results found for: "PROBNP"  PFT No results found for: "FEV1PRE", "FEV1POST", "FVCPRE", "FVCPOST", "TLC", "DLCOUNC", "PREFEV1FVCRT", "PSTFEV1FVCRT"  No results found.   Past medical hx Past Medical History:  Diagnosis Date   Anxiety and depression    Hyperlipidemia    Right bundle branch block    Vitamin D deficiency      Social History   Tobacco Use   Smoking status: Some Days    Types: Cigarettes   Smokeless tobacco: Never   Tobacco comments:    Smoking 1-2 cigarettes per day.  06/06/22 hfb  Vaping Use   Vaping Use: Never used  Substance Use Topics   Alcohol use: Yes   Drug use: Yes    Types: Other-see comments    Comment: CBD gummies to help with sleep    [name] reports that she has been smoking cigarettes. She has never used smokeless tobacco. She reports current alcohol use. She reports current drug use. Drug: Other-see comments.  Tobacco Cessation: Current every day smoker  Past surgical hx, Family hx, Social hx all reviewed.  Current Outpatient Medications on  File Prior to Visit  Medication Sig   Omega-3 Fatty Acids (FISH OIL) 1000 MG CAPS Take 1 capsule by mouth daily.   simvastatin (ZOCOR) 10 MG tablet Take 10 mg by mouth at bedtime.   venlafaxine XR (EFFEXOR-XR) 37.5 MG 24 hr capsule TAKE 1 CAPSULE BY MOUTH DAILY WITH BREAKFAST.   Vitamin D, Ergocalciferol, (DRISDOL) 1.25 MG (50000 UNIT) CAPS capsule Take 1 capsule (50,000 Units total) by mouth once a week. Schedule labs before next refill.   No current facility-administered medications on file prior to visit.     Allergies  Allergen Reactions   Penicillins Rash   Sulfa Antibiotics Hives and Rash    Review Of Systems:  Constitutional:   No  weight loss, night sweats,  Fevers, chills, fatigue, or  lassitude.  HEENT:   No headaches,  Difficulty swallowing,  Tooth/dental problems, or  Sore throat,                No  sneezing, itching, ear ache, nasal congestion, post nasal drip,   CV:  No chest pain,  Orthopnea, PND, swelling in lower extremities, anasarca, dizziness, palpitations, syncope.   GI  No heartburn, indigestion, abdominal pain, nausea, vomiting, diarrhea, change in bowel habits, loss of appetite, bloody stools.   Resp: No shortness of breath with exertion or at rest.  No excess mucus, no productive cough,  No non-productive cough,  No coughing up of blood.  No change in color of mucus.  No wheezing.  No chest wall deformity  Skin: no rash or lesions.  GU: no dysuria, change in color of urine, no urgency or frequency.  No flank pain, no hematuria   MS:  No joint pain or swelling.  No decreased range of motion.  No back pain.  Psych:  No change in mood or affect. No depression or anxiety.  No memory loss.   Vital Signs BP 106/80 (BP Location: Right Arm, Patient Position: Sitting, Cuff Size: Large)   Pulse 83   Temp 98.4 F (36.9 C) (Oral)   Ht 5\' 6"  (1.676 m)   Wt 203 lb (92.1 kg)   SpO2 99%   BMI 32.77 kg/m    Physical Exam:  General- No distress,  A&Ox3, pleasant  ENT: No sinus tenderness, TM clear, pale nasal mucosa, no oral exudate,no post nasal drip, no LAN Cardiac: S1, S2, regular rate and rhythm, no murmur Chest: No wheeze/ rales/ dullness; no accessory muscle use, no nasal flaring, no sternal retractions Abd.: Soft Non-tender, ND, BS +, Body mass index is 32.77 kg/m. Ext: No clubbing cyanosis, edema Neuro:  normal strength, MAE x 4, A&O x 3 Skin: No rashes, warm and dry, no lesions  Psych: normal mood and behavior   Assessment/Plan OSA on CPAP Symptoms have resolved with therapy AHI from 14.1- 0.9 Plan Your Down Load shows excellent control of your sleep apnea with your CPAP therapy.  Continue on CPAP at bedtime. You appear to be benefiting from the treatment  Goal is to wear for at least 6 hours each night for maximal clinical benefit. Continue to work on  weight loss, as the link between excess weight  and sleep apnea is well established.   Remember to establish a good bedtime routine, and work on sleep hygiene.  Limit daytime naps , avoid stimulants such as caffeine and nicotine close to bedtime, exercise daily to promote sleep quality, avoid heavy , spicy, fried , or rich foods before bed. Ensure adequate exposure to natural light  during the day,establish a relaxing bedtime routine with a pleasant sleep environment ( Bedroom between 60 and 67 degrees, turn off bright lights , TV or device screens screens , consider black out curtains or white noise machines) Do not drive if sleepy. Remember to clean mask, tubing, filter, and reservoir once weekly with soapy water.  Follow up with  Dr. Wynona Neat in 6 months or before as needed.   Please contact office for sooner follow up if symptoms do not improve or worsen or seek emergency care    I spent 40 minutes dedicated to the care of this patient on the date of this encounter to include pre-visit review of records, face-to-face time with the patient discussing conditions above, post visit ordering of testing, clinical documentation with the electronic health record, making appropriate referrals as documented, and communicating necessary information to the patient's healthcare team.   Bevelyn Ngo, NP 06/16/2022  6:07 PM

## 2022-06-16 NOTE — Telephone Encounter (Signed)
Called [employer] to get associated with patient account.  I was on the phone for 20 minutes and stil could not pull up download.  They will call back when they have it completed.  They were proved with the address, phone #, pt name dob and provider NPI.  Will await return call.

## 2022-06-16 NOTE — Telephone Encounter (Signed)
Return call from [employer] [phone] calling to see if you were ab;e to pull up report.Colleen Glass

## 2022-06-16 NOTE — Telephone Encounter (Signed)
Checked [employer] to see if we are able to pull pt up to get a download and saw that [employer] has been updated. Sent message to Joline Maxcy letting her know that this had been taken care of.

## 2022-06-16 NOTE — Telephone Encounter (Signed)
ATC Melissa with [employer] regarding the 18 minutes on the phone attempting to get download information for Maralyn Sago to review with patient that was a new start on CPAP.  Left detailed message that the patient was sitting waiting for [place] to get access to the download so Maralyn Sago could review with the patient which was the sole reason for her visit.  I also shared that this has become a regular occurrence with our new starts with CPAP or Bip[facility] with [employer] that we either have to call the day before or the day of the visit to gain access to the account.  Advised that the order that is sent clearly states to set up in airview and they should be able to associate the ordering practice/provider with the account so we do not waste our time, the patient's time or the providers time waiting on the phone.  The process also causes [place] to get behind in clinic as we are tethered to a phone while waiting on someone to complete the task.  Advised there has to be a better way to get this set up.  Left a message to return call.

## 2022-06-16 NOTE — Patient Instructions (Addendum)
It is good to see you today. I am so glad the CPAP therapy has helped you with sleep.  Your Down Load shows excellent control of your sleep apnea with your CPAP therapy.  Continue on CPAP at bedtime. You appear to be benefiting from the treatment  Goal is to wear for at least 6 hours each night for maximal clinical benefit. Continue to work on weight loss, as the link between excess weight  and sleep apnea is well established.   Remember to establish a good bedtime routine, and work on sleep hygiene.  Limit daytime naps , avoid stimulants such as caffeine and nicotine close to bedtime, exercise daily to promote sleep quality, avoid heavy , spicy, fried , or rich foods before bed. Ensure adequate exposure to natural light during the day,establish a relaxing bedtime routine with a pleasant sleep environment ( Bedroom between 60 and 67 degrees, turn off bright lights , TV or device screens screens , consider black out curtains or white noise machines) Do not drive if sleepy. Remember to clean mask, tubing, filter, and reservoir once weekly with soapy water.  Follow up with  Dr. Wynona Neat in 6 months or before as needed.   Please contact office for sooner follow up if symptoms do not improve or worsen or seek emergency care

## 2022-06-28 ENCOUNTER — Other Ambulatory Visit: Payer: Self-pay | Admitting: Family Medicine

## 2022-06-28 DIAGNOSIS — F32A Depression, unspecified: Secondary | ICD-10-CM

## 2022-06-28 DIAGNOSIS — F419 Anxiety disorder, unspecified: Secondary | ICD-10-CM

## 2022-07-14 ENCOUNTER — Ambulatory Visit (INDEPENDENT_AMBULATORY_CARE_PROVIDER_SITE_OTHER): Payer: BC Managed Care – PPO | Admitting: Family Medicine

## 2022-07-14 ENCOUNTER — Encounter: Payer: Self-pay | Admitting: Family Medicine

## 2022-07-14 VITALS — BP 120/68 | HR 95 | Ht 66.0 in | Wt 204.2 lb

## 2022-07-14 DIAGNOSIS — E669 Obesity, unspecified: Secondary | ICD-10-CM

## 2022-07-14 DIAGNOSIS — E782 Mixed hyperlipidemia: Secondary | ICD-10-CM

## 2022-07-14 DIAGNOSIS — Z23 Encounter for immunization: Secondary | ICD-10-CM

## 2022-07-14 DIAGNOSIS — R1013 Epigastric pain: Secondary | ICD-10-CM

## 2022-07-14 DIAGNOSIS — E66811 Obesity, class 1: Secondary | ICD-10-CM

## 2022-07-14 DIAGNOSIS — G4733 Obstructive sleep apnea (adult) (pediatric): Secondary | ICD-10-CM

## 2022-07-14 DIAGNOSIS — Z6833 Body mass index (BMI) 33.0-33.9, adult: Secondary | ICD-10-CM | POA: Diagnosis not present

## 2022-07-14 DIAGNOSIS — F32A Depression, unspecified: Secondary | ICD-10-CM

## 2022-07-14 DIAGNOSIS — L723 Sebaceous cyst: Secondary | ICD-10-CM

## 2022-07-14 DIAGNOSIS — A048 Other specified bacterial intestinal infections: Secondary | ICD-10-CM

## 2022-07-14 DIAGNOSIS — F419 Anxiety disorder, unspecified: Secondary | ICD-10-CM | POA: Diagnosis not present

## 2022-07-14 DIAGNOSIS — R7303 Prediabetes: Secondary | ICD-10-CM

## 2022-07-14 DIAGNOSIS — E559 Vitamin D deficiency, unspecified: Secondary | ICD-10-CM | POA: Diagnosis not present

## 2022-07-14 DIAGNOSIS — H938X3 Other specified disorders of ear, bilateral: Secondary | ICD-10-CM

## 2022-07-14 HISTORY — DX: Obesity, class 1: E66.811

## 2022-07-14 HISTORY — DX: Prediabetes: R73.03

## 2022-07-14 HISTORY — DX: Obesity, unspecified: E66.9

## 2022-07-14 HISTORY — DX: Mixed hyperlipidemia: E78.2

## 2022-07-14 LAB — COMPREHENSIVE METABOLIC PANEL
ALT: 16 U/L (ref 0–35)
AST: 18 U/L (ref 0–37)
Albumin: 4.3 g/dL (ref 3.5–5.2)
Alkaline Phosphatase: 53 U/L (ref 39–117)
BUN: 9 mg/dL (ref 6–23)
CO2: 30 mEq/L (ref 19–32)
Calcium: 9.5 mg/dL (ref 8.4–10.5)
Chloride: 103 mEq/L (ref 96–112)
Creatinine, Ser: 0.94 mg/dL (ref 0.40–1.20)
GFR: 69.31 mL/min (ref >60.00)
Glucose, Bld: 88 mg/dL (ref 70–99)
Potassium: 4.5 mEq/L (ref 3.5–5.1)
Sodium: 140 mEq/L (ref 135–145)
Total Bilirubin: 0.3 mg/dL (ref 0.2–1.2)
Total Protein: 7 g/dL (ref 6.0–8.3)

## 2022-07-14 LAB — CBC
HCT: 43 % (ref 36.0–46.0)
Hemoglobin: 14.2 g/dL (ref 12.0–15.0)
MCHC: 33 g/dL (ref 30.0–36.0)
MCV: 97.1 fl (ref 78.0–100.0)
Platelets: 173 10*3/uL (ref 150.0–400.0)
RBC: 4.43 Mil/uL (ref 3.87–5.11)
RDW: 14 % (ref 11.5–15.5)
WBC: 5 10*3/uL (ref 4.0–10.5)

## 2022-07-14 LAB — VITAMIN D 25 HYDROXY (VIT D DEFICIENCY, FRACTURES): VITD: 33.95 ng/mL (ref 30.00–100.00)

## 2022-07-14 LAB — LIPID PANEL
Cholesterol: 261 mg/dL — ABNORMAL HIGH (ref 0–200)
HDL: 67.9 mg/dL (ref >39.00)
LDL Cholesterol: 164 mg/dL — ABNORMAL HIGH (ref 0–99)
NonHDL: 192.88
Total CHOL/HDL Ratio: 4
Triglycerides: 144 mg/dL (ref 0.0–149.0)
VLDL: 28.8 mg/dL (ref 0.0–40.0)

## 2022-07-14 LAB — TSH: TSH: 1.38 u[IU]/mL (ref 0.35–5.50)

## 2022-07-14 LAB — HEMOGLOBIN A1C: Hgb A1c MFr Bld: 5.7 % (ref 4.6–6.5)

## 2022-07-14 MED ORDER — PAROXETINE HCL 20 MG PO TABS: 20.0000 mg | ORAL_TABLET | Freq: Every day | ORAL | 3 refills | Status: DC

## 2022-07-14 NOTE — Assessment & Plan Note (Signed)
Rechecking labs today.

## 2022-07-14 NOTE — Assessment & Plan Note (Signed)
-  Reviewed most recent lipid panel -Medication management: continue simvastatin and Omega-3 -Repeat CMP and lipid panel today -Diet low in saturated fat -Regular exercise - at least 30 minutes, 5 times per week  

## 2022-07-14 NOTE — Assessment & Plan Note (Signed)
  Changing Effexor to Paxil to see if you tolerate this better.  Taking the medicine as directed and not missing any doses is one of the best things you can do to treat your anxiety/depression.  Here are some things to keep in mind: - Side effects (stomach upset, some increased anxiety) may happen before you notice a benefit.  These side effects typically go away over time. - Changes to your dose of medicine or a change in medication all together is sometimes necessary - Many people will notice an improvement within two weeks but the full effect of the medication can take up to 4-6 weeks - Stopping the medication when you start feeling better often results in a return of symptoms. Most people need to be on medication at least 6-12 months If you start having thoughts of hurting yourself or others after starting this medicine, please call me immediately.

## 2022-07-14 NOTE — Assessment & Plan Note (Signed)
Asymptomatic.  Updating labs today.  Discussed low-carb diet, portion control, exercise, and weight management.  

## 2022-07-14 NOTE — Assessment & Plan Note (Signed)
Updating labs today.  Discussed diet and exercise habits.

## 2022-07-14 NOTE — Assessment & Plan Note (Signed)
Getting good response with CPAP.  Continue following with pulmonology

## 2022-07-14 NOTE — Progress Notes (Signed)
Established Patient Office Visit  Subjective   Patient ID: Colleen Glass, female    DOB: 07/29/1971  Age: 51 y.o. MRN: [id number]  CC: routine f/u    HPI  Colleen Glass is here for routine follow-up. She is doing fairly well overall.    Anxiety and depression: - Effexor XR 37.5 mg daily,  - Med is making her nauseous and decreasing oral intake. Even taking with food doesn't help. Stopped taking it a few days ago, no new side effects since stopping. She is interested in switching. She didn't get much response with Wellbutrin or Lexapro in the past. She was on Paxil temporarily in the past after a still birth and would like to try again.  - No SI/HI    Sleep Apnea: - Sleep study at [facility] confirmed mild sleep apnea and she was started on CPAP in 03/2022. She is now following with  Pulmonology for ongoing management - she was changed to CPAP with nasal pillow. She most recently followed-up with them on 06/16/22 and download showed excellent control of OSA with CPAP. Scheduled to follow-up with them in 6 months. - States she is sleeping and feeling much better overall   HYPERLIPIDEMIA - medications: simvastatin 10 mg, Omega-3 - compliance: good - medication SEs: none The 10-year ASCVD risk score (Arnett DK, et al., 2019) is: 3.9%   Values used to calculate the score:     Age: 30 years     Sex: Female     Is Non-Hispanic African American: Yes     Diabetic: No     Tobacco smoker: Yes     Systolic Blood Pressure: 120 mmHg     Is BP treated: No     HDL Cholesterol: 64.1 mg/dL     Total Cholesterol: 225 mg/dL   Epigastric Discomfort: - Patient she reports she has had a stressful few months and is starting to feel some similar symptoms as to when she had H.pylori in the past. Reports intermittent epigastric discomfort up to 5/10 at times, food doesn't "settle" well. No vomiting, diarrhea, other abdominal pain.   Blackhead: - Patient reports she had what she originally  thought was a pimple/blackhead for over a year to her right/mid upper chest. At one point she was able to pop it and get some pus out, but now it is getting larger and not able to drain. It is not red or inflamed. States she would like it removed - requesting referral.    Ear fullness: - Patient reports that she has been having some occasional bilateral ear pressure/fullness that comes and goes for minutes at a time maybe a few times per week. During these episodes she feels like she can hear her heartbeat and it doesn't always sound regular. She never has any chest pain, palpitations, shortness of breath, diaphoresis, lightheadedness during these times. States it just feels like her head is full, but then resolves.        ROS All review of systems negative except what is listed in the HPI    Objective:     BP 120/68   Pulse 95   Ht 5\' 6"  (1.676 m)   Wt 204 lb 3.2 oz (92.6 kg)   BMI 32.96 kg/m     Physical Exam Vitals reviewed.  Constitutional:      General: She is not in acute distress.    Appearance: Normal appearance. She is obese. She is not ill-appearing.  HENT:     Head: Normocephalic and  atraumatic.     Right Ear: Tympanic membrane normal.     Left Ear: Tympanic membrane normal.     Nose: Nose normal.     Mouth/Throat:     Mouth: Mucous membranes are moist.     Pharynx: Oropharynx is clear.  Eyes:     Extraocular Movements: Extraocular movements intact.     Conjunctiva/sclera: Conjunctivae normal.  Cardiovascular:     Rate and Rhythm: Normal rate and regular rhythm.     Pulses: Normal pulses.     Heart sounds: Normal heart sounds.  Pulmonary:     Effort: Pulmonary effort is normal.     Breath sounds: Normal breath sounds.  Musculoskeletal:     Cervical back: Normal range of motion and neck supple.     Right lower leg: No edema.     Left lower leg: No edema.  Skin:    General: Skin is warm and dry.  Neurological:     Mental Status: She is alert and  oriented to person, place, and time.  Psychiatric:        Mood and Affect: Mood normal.        Behavior: Behavior normal.        Thought Content: Thought content normal.        Judgment: Judgment normal.         No results found for any visits on 07/14/22.    The 10-year ASCVD risk score (Arnett DK, et al., 2019) is: 3.9%    Assessment & Plan:   Problem List Items Addressed This Visit       Respiratory   OSA (obstructive sleep apnea) - Primary    Getting good response with CPAP.  Continue following with pulmonology         Other   Anxiety and depression     Changing Effexor to Paxil to see if you tolerate this better.  Taking the medicine as directed and not missing any doses is one of the best things you can do to treat your anxiety/depression.  Here are some things to keep in mind: Side effects (stomach upset, some increased anxiety) may happen before you notice a benefit.  These side effects typically go away over time. Changes to your dose of medicine or a change in medication all together is sometimes necessary Many people will notice an improvement within two weeks but the full effect of the medication can take up to 4-6 weeks Stopping the medication when you start feeling better often results in a return of symptoms. Most people need to be on medication at least 6-12 months If you start having thoughts of hurting yourself or others after starting this medicine, please call me immediately.        Relevant Medications   PARoxetine (PAXIL) 20 MG tablet   Prediabetes    Asymptomatic.  Updating labs today.  Discussed low-carb diet, portion control, exercise, and weight management.       Relevant Orders   Hemoglobin A1c   CBC   Comprehensive metabolic panel   Lipid panel   TSH   Mixed hyperlipidemia    -Reviewed most recent lipid panel -Medication management: continue simvastatin and Omega-3 -Repeat CMP and lipid panel today -Diet low in saturated  fat -Regular exercise - at least 30 minutes, 5 times per week       Relevant Orders   Hemoglobin A1c   CBC   Comprehensive metabolic panel   Lipid panel   TSH   Vitamin  D deficiency    Rechecking labs today.       Relevant Orders   Vitamin D (25 hydroxy)   Class 1 obesity without serious comorbidity with body mass index (BMI) of 33.0 to 33.9 in adult    Updating labs today.  Discussed diet and exercise habits.      Relevant Orders   Hemoglobin A1c   Comprehensive metabolic panel   TSH   Other Visit Diagnoses     Sebaceous cyst     Does not appear infected today.  She would like referral to general surgery for removal.  Patient aware of signs/symptoms requiring further/urgent evaluation.    Relevant Orders   Ambulatory referral to General Surgery   Epigastric discomfort     Presentation similar to when she had H. Pylori in the past - requesting retest.  She has been communicating with GI and is due for colonoscopy this fall - waiting for them to schedule for her.    Relevant Orders   H. pylori breath test   Need for shingles vaccine       Relevant Orders   Zoster Recombinant (Shingrix ) (Completed)   Sensation of fullness in both ears     Recommend starting with daily Flonase and give this a few week trial. If not helping or if new symptoms develop, follow-up.        Return in about 6 months (around 01/14/2023) for routine f/u .    Clayborne Dana, NP

## 2022-07-14 NOTE — Patient Instructions (Addendum)
For the fullness/tinnitus, recommend starting with daily Flonase and give this a few week trial. If not helping or if new symptoms develop, follow-up.   Changing Effexor to Paxil to see if you tolerate this better.  Taking the medicine as directed and not missing any doses is one of the best things you can do to treat your anxiety/depression.  Here are some things to keep in mind: Side effects (stomach upset, some increased anxiety) may happen before you notice a benefit.  These side effects typically go away over time. Changes to your dose of medicine or a change in medication all together is sometimes necessary Many people will notice an improvement within two weeks but the full effect of the medication can take up to 4-6 weeks Stopping the medication when you start feeling better often results in a return of symptoms. Most people need to be on medication at least 6-12 months If you start having thoughts of hurting yourself or others after starting this medicine, please call me immediately.    Updating labs today. We will let you know if we need to make any changes.   Checking H. Pylori test given your similar epigastric symptoms.

## 2022-07-17 ENCOUNTER — Other Ambulatory Visit: Payer: Self-pay | Admitting: Family Medicine

## 2022-07-17 DIAGNOSIS — E782 Mixed hyperlipidemia: Secondary | ICD-10-CM

## 2022-07-17 MED ORDER — SIMVASTATIN 20 MG PO TABS
20.0000 mg | ORAL_TABLET | Freq: Every day | ORAL | 0 refills | Status: DC
Start: 2022-07-17 — End: 2022-10-13

## 2022-07-17 NOTE — Progress Notes (Signed)
Your A1c is starting to come down some, but your cholesterol is higher now. Let's increase to 20 mg of simvastatin and continue the Omega-3 and lifestyle modifications.   Lifestyle factors for lowering cholesterol include: Diet therapy - heart-healthy diet rich in fruits, veggies, fiber-rich whole grains, lean meats, chicken, fish (at least twice a week), fat-free or 1% dairy products; foods low in saturated/trans fats, cholesterol, sodium, and sugar. Mediterranean diet has shown to be very heart healthy. Regular exercise - recommend at least 30 minutes a day, 5 times per week Weight management    The 10-year ASCVD risk score (Arnett DK, et al., 2019) is: 4.2%   Values used to calculate the score:     Age: 51 years     Sex: Female     Is Non-Hispanic African American: Yes     Diabetic: No     Tobacco smoker: Yes     Systolic Blood Pressure: 120 mmHg     Is BP treated: No     HDL Cholesterol: 67.9 mg/dL     Total Cholesterol: 261 mg/dL

## 2022-07-19 LAB — H. PYLORI BREATH TEST: H. pylori Breath Test: DETECTED — AB

## 2022-07-19 MED ORDER — METRONIDAZOLE 500 MG PO TABS: 500.0000 mg | ORAL_TABLET | Freq: Three times a day (TID) | ORAL | 0 refills | Status: AC

## 2022-07-19 MED ORDER — CLARITHROMYCIN 500 MG PO TABS: 500.0000 mg | ORAL_TABLET | Freq: Two times a day (BID) | ORAL | 0 refills | Status: AC

## 2022-07-19 MED ORDER — PANTOPRAZOLE SODIUM 40 MG PO TBEC: 40.0000 mg | DELAYED_RELEASE_TABLET | Freq: Two times a day (BID) | ORAL | 0 refills | Status: DC

## 2022-07-19 NOTE — Addendum Note (Signed)
Addended by: Hyman Hopes B on: 07/19/2022 04:03 PM   Modules accepted: Orders

## 2022-07-19 NOTE — Progress Notes (Signed)
Your H. Pylori test came back positive. I will start you on the triple therapy regimen (2 antibiotics and a proton pump inhibitor) to get rid of this. We recommend retesting for this in about 4 weeks (2 weeks after finishing treatment) to ensure it is gone. Do not use alcohol during or 3 days after the antibiotics. There is a potential interaction between the antibiotic and your cholesterol medication (simvastatin), so skip your cholesterol medicine while on the antibiotics and focus on healthy food choices, Omega-3 supplement and exercise.

## 2022-08-06 ENCOUNTER — Other Ambulatory Visit: Payer: Self-pay | Admitting: Family Medicine

## 2022-08-06 ENCOUNTER — Encounter: Payer: Self-pay | Admitting: Family Medicine

## 2022-08-06 DIAGNOSIS — F419 Anxiety disorder, unspecified: Secondary | ICD-10-CM

## 2022-08-07 MED ORDER — TRAZODONE HCL 50 MG PO TABS: 25.0000 mg | ORAL_TABLET | Freq: Every evening | ORAL | 3 refills | Status: DC | PRN

## 2022-08-11 ENCOUNTER — Telehealth: Payer: BC Managed Care – PPO | Admitting: Family

## 2022-08-11 ENCOUNTER — Ambulatory Visit: Payer: BC Managed Care – PPO | Admitting: Family Medicine

## 2022-08-11 DIAGNOSIS — G4700 Insomnia, unspecified: Secondary | ICD-10-CM

## 2022-08-11 NOTE — Progress Notes (Signed)
  Colleen Glass is a 51 y.o. female with the following history as recorded in Epic[facility]:  Patient Active Problem List   Diagnosis Date Noted   Prediabetes 07/14/2022   Mixed hyperlipidemia 07/14/2022   Vitamin D deficiency 07/14/2022   Class 1 obesity without serious comorbidity with body mass index (BMI) of 33.0 to 33.9 in adult 07/14/2022   Anxiety and depression 03/03/2022   OSA (obstructive sleep apnea) 03/03/2022    Current Outpatient Medications  Medication Sig Dispense Refill   Omega-3 Fatty Acids (FISH OIL) 1000 MG CAPS Take 1 capsule by mouth daily.     PARoxetine (PAXIL) 20 MG tablet Take 1 tablet (20 mg total) by mouth daily. 90 tablet 1   simvastatin (ZOCOR) 20 MG tablet Take 1 tablet (20 mg total) by mouth at bedtime. 90 tablet 0   traZODone (DESYREL) 50 MG tablet Take 0.5-1 tablets (25-50 mg total) by mouth at bedtime as needed for sleep. 30 tablet 3   Vitamin D, Ergocalciferol, (DRISDOL) 1.25 MG (50000 UNIT) CAPS capsule Take 1 capsule (50,000 Units total) by mouth once a week. Schedule labs before next refill. 5 capsule 3   pantoprazole (PROTONIX) 40 MG tablet Take 1 tablet (40 mg total) by mouth 2 (two) times daily for 14 days. 28 tablet 0   No current facility-administered medications for this visit.    Allergies: Penicillins and Sulfa antibiotics  Past Medical History:  Diagnosis Date   Anxiety and depression    Hyperlipidemia    Right bundle branch block    Vitamin D deficiency     Past Surgical History:  Procedure Laterality Date   ABDOMINAL HYSTERECTOMY     left knee athroscopy      Family History  Problem Relation Age of Onset   Hypertension Mother    Hyperlipidemia Mother    Hypertension Father    Hyperlipidemia Father    Hypertension Maternal Grandmother     Social History   Tobacco Use   Smoking status: Some Days    Types: Cigarettes   Smokeless tobacco: Never   Tobacco comments:    Smoking 1-2 cigarettes per day.  06/06/22 hfb  Substance  Use Topics   Alcohol use: Yes    Subjective:    I connected with Colleen Glass on 08/11/22 at  8:40 AM EDT by a video enabled telemedicine application and verified that I am speaking with the correct person using two identifiers.   I discussed the limitations of evaluation and management by telemedicine and the availability of in person appointments. The patient expressed understanding and agreed to proceed. Provider in office/ patient is at home; provider and patient are only 2 people on video call.   Appointment had originally been scheduled to get prescription for Trazodone; another provider in the office has already called in RX and patient was unaware until this am; no other questions or concerns;  Will cancel appointment today;    Assessment:  1. Insomnia, unspecified type     Plan:  Patient already has refill on prescription; will cancel for today;   No follow-ups on file.  No orders of the defined types were placed in this encounter.   Requested Prescriptions    No prescriptions requested or ordered in this encounter

## 2022-08-18 ENCOUNTER — Ambulatory Visit: Payer: BC Managed Care – PPO | Admitting: Family

## 2022-08-20 ENCOUNTER — Encounter: Payer: Self-pay | Admitting: Family

## 2022-08-22 ENCOUNTER — Encounter: Payer: Self-pay | Admitting: Gastroenterology

## 2022-08-22 ENCOUNTER — Other Ambulatory Visit: Payer: Self-pay | Admitting: Family

## 2022-08-22 DIAGNOSIS — R109 Unspecified abdominal pain: Secondary | ICD-10-CM

## 2022-08-22 DIAGNOSIS — R634 Abnormal weight loss: Secondary | ICD-10-CM

## 2022-08-22 DIAGNOSIS — A048 Other specified bacterial intestinal infections: Secondary | ICD-10-CM

## 2022-08-30 ENCOUNTER — Other Ambulatory Visit: Payer: Self-pay | Admitting: Family Medicine

## 2022-09-25 ENCOUNTER — Other Ambulatory Visit: Payer: Self-pay | Admitting: Family Medicine

## 2022-09-25 DIAGNOSIS — F32A Depression, unspecified: Secondary | ICD-10-CM

## 2022-10-04 DIAGNOSIS — A048 Other specified bacterial intestinal infections: Secondary | ICD-10-CM

## 2022-10-04 DIAGNOSIS — Z860100 Personal history of colon polyps, unspecified: Secondary | ICD-10-CM | POA: Insufficient documentation

## 2022-10-04 DIAGNOSIS — R1013 Epigastric pain: Secondary | ICD-10-CM

## 2022-10-04 HISTORY — DX: Epigastric pain: R10.13

## 2022-10-04 HISTORY — DX: Personal history of colon polyps, unspecified: Z86.0100

## 2022-10-04 HISTORY — DX: Other specified bacterial intestinal infections: A04.8

## 2022-10-05 ENCOUNTER — Encounter: Payer: Self-pay | Admitting: Family

## 2022-10-13 ENCOUNTER — Other Ambulatory Visit: Payer: Self-pay | Admitting: Family Medicine

## 2022-10-13 DIAGNOSIS — E782 Mixed hyperlipidemia: Secondary | ICD-10-CM

## 2022-10-26 ENCOUNTER — Ambulatory Visit: Payer: BC Managed Care – PPO | Admitting: Gastroenterology

## 2022-10-30 ENCOUNTER — Other Ambulatory Visit: Payer: Self-pay | Admitting: Family Medicine

## 2022-11-08 ENCOUNTER — Encounter: Payer: Self-pay | Admitting: Family Medicine

## 2022-11-11 ENCOUNTER — Other Ambulatory Visit: Payer: Self-pay

## 2022-11-11 MED ORDER — TRAZODONE HCL 50 MG PO TABS: 25.0000 mg | ORAL_TABLET | Freq: Every evening | ORAL | 1 refills | Status: DC | PRN

## 2022-11-11 NOTE — Telephone Encounter (Signed)
Medication sent.

## 2022-11-17 ENCOUNTER — Encounter: Payer: Self-pay | Admitting: Family

## 2022-11-17 MED ORDER — TRAZODONE HCL 50 MG PO TABS: 25.0000 mg | ORAL_TABLET | Freq: Every evening | ORAL | 1 refills | Status: DC | PRN

## 2023-01-05 ENCOUNTER — Encounter: Payer: Self-pay | Admitting: Family

## 2023-01-05 NOTE — Telephone Encounter (Signed)
No changes but if your not needing appointment. We can cancel Just let us know thanks.

## 2023-01-12 ENCOUNTER — Ambulatory Visit: Payer: BC Managed Care – PPO | Admitting: Family Medicine

## 2023-01-16 ENCOUNTER — Other Ambulatory Visit: Payer: Self-pay | Admitting: Family Medicine

## 2023-01-26 ENCOUNTER — Ambulatory Visit (INDEPENDENT_AMBULATORY_CARE_PROVIDER_SITE_OTHER): Payer: BC Managed Care – PPO | Admitting: Family Medicine

## 2023-01-26 ENCOUNTER — Encounter: Payer: Self-pay | Admitting: Family Medicine

## 2023-01-26 VITALS — BP 122/87 | HR 89 | Temp 98.4°F | Resp 16 | Ht 66.0 in | Wt 207.0 lb

## 2023-01-26 DIAGNOSIS — E559 Vitamin D deficiency, unspecified: Secondary | ICD-10-CM | POA: Diagnosis not present

## 2023-01-26 DIAGNOSIS — I4510 Unspecified right bundle-branch block: Secondary | ICD-10-CM | POA: Diagnosis not present

## 2023-01-26 DIAGNOSIS — R5383 Other fatigue: Secondary | ICD-10-CM | POA: Diagnosis not present

## 2023-01-26 DIAGNOSIS — F419 Anxiety disorder, unspecified: Secondary | ICD-10-CM

## 2023-01-26 DIAGNOSIS — G4709 Other insomnia: Secondary | ICD-10-CM

## 2023-01-26 DIAGNOSIS — F32A Depression, unspecified: Secondary | ICD-10-CM

## 2023-01-26 DIAGNOSIS — L723 Sebaceous cyst: Secondary | ICD-10-CM

## 2023-01-26 DIAGNOSIS — E782 Mixed hyperlipidemia: Secondary | ICD-10-CM

## 2023-01-26 DIAGNOSIS — R7303 Prediabetes: Secondary | ICD-10-CM

## 2023-01-26 LAB — VITAMIN B12: Vitamin B-12: 215 pg/mL (ref 211–911)

## 2023-01-26 LAB — COMPREHENSIVE METABOLIC PANEL
ALT: 20 U/L (ref 0–35)
AST: 21 U/L (ref 0–37)
Albumin: 4.3 g/dL (ref 3.5–5.2)
Alkaline Phosphatase: 54 U/L (ref 39–117)
BUN: 11 mg/dL (ref 6–23)
CO2: 30 mEq/L (ref 19–32)
Calcium: 10 mg/dL (ref 8.4–10.5)
Chloride: 102 mEq/L (ref 96–112)
Creatinine, Ser: 0.93 mg/dL (ref 0.40–1.20)
GFR: 69.94 mL/min (ref >60.00)
Glucose, Bld: 83 mg/dL (ref 70–99)
Potassium: 4.9 mEq/L (ref 3.5–5.1)
Sodium: 140 mEq/L (ref 135–145)
Total Bilirubin: 0.5 mg/dL (ref 0.2–1.2)
Total Protein: 7.1 g/dL (ref 6.0–8.3)

## 2023-01-26 LAB — LIPID PANEL
Cholesterol: 221 mg/dL — ABNORMAL HIGH (ref 0–200)
HDL: 71 mg/dL (ref >39.00)
LDL Cholesterol: 126 mg/dL — ABNORMAL HIGH (ref 0–99)
NonHDL: 150.16
Total CHOL/HDL Ratio: 3
Triglycerides: 122 mg/dL (ref 0.0–149.0)
VLDL: 24.4 mg/dL (ref 0.0–40.0)

## 2023-01-26 LAB — CBC WITH DIFFERENTIAL/PLATELET
Basophils Absolute: 0 10*3/uL (ref 0.0–0.1)
Basophils Relative: 1 % (ref 0.0–3.0)
Eosinophils Absolute: 0.1 10*3/uL (ref 0.0–0.7)
Eosinophils Relative: 1.1 % (ref 0.0–5.0)
HCT: 46.7 % — ABNORMAL HIGH (ref 36.0–46.0)
Hemoglobin: 15.6 g/dL — ABNORMAL HIGH (ref 12.0–15.0)
Lymphocytes Relative: 40.6 % (ref 12.0–46.0)
Lymphs Abs: 2.1 10*3/uL (ref 0.7–4.0)
MCHC: 33.3 g/dL (ref 30.0–36.0)
MCV: 96.5 fl (ref 78.0–100.0)
Monocytes Absolute: 0.4 10*3/uL (ref 0.1–1.0)
Monocytes Relative: 8.1 % (ref 3.0–12.0)
Neutro Abs: 2.6 10*3/uL (ref 1.4–7.7)
Neutrophils Relative %: 49.2 % (ref 43.0–77.0)
Platelets: 170 10*3/uL (ref 150.0–400.0)
RBC: 4.84 Mil/uL (ref 3.87–5.11)
RDW: 14.2 % (ref 11.5–15.5)
WBC: 5.2 10*3/uL (ref 4.0–10.5)

## 2023-01-26 LAB — TSH: TSH: 1.93 u[IU]/mL (ref 0.35–5.50)

## 2023-01-26 LAB — VITAMIN D 25 HYDROXY (VIT D DEFICIENCY, FRACTURES): VITD: 43.52 ng/mL (ref 30.00–100.00)

## 2023-01-26 LAB — HEMOGLOBIN A1C: Hgb A1c MFr Bld: 5.6 % (ref 4.6–6.5)

## 2023-01-26 MED ORDER — PAROXETINE HCL 30 MG PO TABS: 30.0000 mg | ORAL_TABLET | Freq: Every day | ORAL | 1 refills | Status: DC

## 2023-01-26 MED ORDER — TRAZODONE HCL 50 MG PO TABS: 25.0000 mg | ORAL_TABLET | Freq: Every evening | ORAL | 0 refills | Status: DC | PRN

## 2023-01-26 NOTE — Assessment & Plan Note (Signed)
Asymptomatic.  Updating labs today.  Discussed low-carb diet, portion control, exercise, and weight management.

## 2023-01-26 NOTE — Assessment & Plan Note (Signed)
Doing well on Paxil, but may benefit from slightly higher dose. Increasing to 30 mg daily.  No SI/HI Continue counseling F/u in 6 weeks to reassess

## 2023-01-26 NOTE — Assessment & Plan Note (Signed)
Rechecking labs today.

## 2023-01-26 NOTE — Assessment & Plan Note (Signed)
-  Reviewed most recent lipid panel -Medication management: continue simvastatin and Omega-3 -Repeat CMP and lipid panel today -Diet low in saturated fat -Regular exercise - at least 30 minutes, 5 times per week

## 2023-01-26 NOTE — Progress Notes (Signed)
Established Patient Office Visit  Subjective   Patient ID: Colleen Glass, female    DOB: February 21, 1971  Age: 52 y.o. MRN: [id number]  Chief Complaint  Patient presents with   Follow-up    Follow up    HPI  Patient is here for routine follow-up.   Mood follow-up: - Diagnosis: anxiety/depression - Treatment: Paxil 20 mg daily - Medication side effects: none - SI/HI: none - Update: She is feeling a little "funky" again, no particular reasons/trigger. Just feeling a little down. She is getting counseling every 2 weeks. She wonders if she needs to increase dose of Paxil.   OSA/insomnia/fatigue: Since using her CPAP and trazodone she has been sleeping so much better and feeling good overall. She is consistently getting 7-8 hours of sleep a night (except for recently because she ran out of trazodone)  States she was previously seeing cardiology for RBBB and would like referral to [facility] cardiologist. Reports occasional fatigue with mild dyspnea at times (thinks it may be sleep/anxiety related). Patient denies any chest pain, palpitations, wheezing, edema, recurrent headaches, vision changes.        ROS All review of systems negative except what is listed in the HPI    Objective:     BP 122/87 (BP Location: Right Arm, Patient Position: Sitting, Cuff Size: Normal)   Pulse 89   Temp 98.4 F (36.9 C) (Oral)   Resp 16   Ht 5' 6"$  (1.676 m)   Wt 207 lb (93.9 kg)   SpO2 96%   BMI 33.41 kg/m    Physical Exam Vitals reviewed.  Constitutional:      General: She is not in acute distress.    Appearance: Normal appearance. She is obese. She is not ill-appearing.  HENT:     Head: Normocephalic and atraumatic.  Cardiovascular:     Rate and Rhythm: Normal rate and regular rhythm.     Pulses: Normal pulses.     Heart sounds: Normal heart sounds.  Pulmonary:     Effort: Pulmonary effort is normal.     Breath sounds: Normal breath sounds.  Musculoskeletal:     Cervical back:  Normal range of motion and neck supple.     Right lower leg: No edema.     Left lower leg: No edema.  Skin:    General: Skin is warm and dry.     Comments: ~1cm palpable cyst (firm, mobile, non-tender) to right upper chest  Neurological:     Mental Status: She is alert and oriented to person, place, and time.  Psychiatric:        Mood and Affect: Mood normal.        Behavior: Behavior normal.        Thought Content: Thought content normal.        Judgment: Judgment normal.       No results found for any visits on 01/26/23.    The 10-year ASCVD risk score (Arnett DK, et al., 2019) is: 4.4%    Assessment & Plan:   Problem List Items Addressed This Visit       Other   Anxiety and depression    Doing well on Paxil, but may benefit from slightly higher dose. Increasing to 30 mg daily.  No SI/HI Continue counseling F/u in 6 weeks to reassess      Relevant Medications   PARoxetine (PAXIL) 30 MG tablet   traZODone (DESYREL) 50 MG tablet   Prediabetes    Asymptomatic.  Updating labs  today.  Discussed low-carb diet, portion control, exercise, and weight management.       Relevant Orders   HgB A1c   Mixed hyperlipidemia    -Reviewed most recent lipid panel -Medication management: continue simvastatin and Omega-3 -Repeat CMP and lipid panel today -Diet low in saturated fat -Regular exercise - at least 30 minutes, 5 times per week       Relevant Orders   HgB A1c   Vitamin D deficiency    Rechecking labs today.       Relevant Orders   Vitamin D (25 hydroxy)   Other Visit Diagnoses     RBBB    -  Primary Patient denies any chest pain, palpitations,  wheezing, edema, recurrent headaches, vision changes.  Requesting [facility] cardiologist for surveillance    Relevant Orders   Ambulatory referral to Cardiology   Fatigue, unspecified type     Updating labs. Possibly related to poor sleep since running out of trazodone and being so busy with work.   Relevant Orders    B12   Comprehensive metabolic panel   CBC with Differential/Platelet   Lipid panel   TSH   Vitamin D (25 hydroxy)   Other insomnia     Refilling trazodone. Continue CPAP.   Relevant Medications   traZODone (DESYREL) 50 MG tablet   Sebaceous cyst     States previous referral fell through so she requested new one be placed.   Relevant Orders   Ambulatory referral to General Surgery       Return in about 6 weeks (around 03/09/2023) for mood f/u.    Terrilyn Saver, NP

## 2023-01-30 ENCOUNTER — Encounter: Payer: Self-pay | Admitting: Family Medicine

## 2023-01-30 ENCOUNTER — Other Ambulatory Visit: Payer: Self-pay | Admitting: Family Medicine

## 2023-01-30 DIAGNOSIS — F32A Depression, unspecified: Secondary | ICD-10-CM

## 2023-01-30 DIAGNOSIS — E782 Mixed hyperlipidemia: Secondary | ICD-10-CM

## 2023-01-30 MED ORDER — SIMVASTATIN 40 MG PO TABS: 40.0000 mg | ORAL_TABLET | Freq: Every day | ORAL | 1 refills | Status: DC

## 2023-03-15 ENCOUNTER — Telehealth: Payer: BC Managed Care – PPO | Admitting: Family Medicine

## 2023-03-25 ENCOUNTER — Encounter: Payer: Self-pay | Admitting: Family Medicine

## 2023-03-25 DIAGNOSIS — Z1231 Encounter for screening mammogram for malignant neoplasm of breast: Secondary | ICD-10-CM

## 2023-03-26 ENCOUNTER — Encounter: Payer: Self-pay | Admitting: Cardiology

## 2023-03-26 ENCOUNTER — Ambulatory Visit: Payer: BC Managed Care – PPO | Attending: Cardiology | Admitting: Cardiology

## 2023-03-26 VITALS — BP 120/78 | HR 87 | Ht 66.0 in | Wt 209.0 lb

## 2023-03-26 DIAGNOSIS — F17210 Nicotine dependence, cigarettes, uncomplicated: Secondary | ICD-10-CM

## 2023-03-26 DIAGNOSIS — R0609 Other forms of dyspnea: Secondary | ICD-10-CM

## 2023-03-26 DIAGNOSIS — I4510 Unspecified right bundle-branch block: Secondary | ICD-10-CM | POA: Insufficient documentation

## 2023-03-26 DIAGNOSIS — G4733 Obstructive sleep apnea (adult) (pediatric): Secondary | ICD-10-CM

## 2023-03-26 DIAGNOSIS — R7303 Prediabetes: Secondary | ICD-10-CM | POA: Diagnosis not present

## 2023-03-26 DIAGNOSIS — E782 Mixed hyperlipidemia: Secondary | ICD-10-CM

## 2023-03-26 NOTE — Progress Notes (Signed)
Cardiology Consultation:    Date:  03/26/2023   ID:  Colleen Glass, DOB Feb 19, 1971, MRN [id number]  PCP:  Clayborne Dana, NP  Cardiologist:  Gypsy Balsam, MD   Referring MD: Clayborne Dana, NP   Chief Complaint  Patient presents with   Heart Eval    History of Present Illness:    Colleen Glass is a 52 y.o. female who is being seen today for the evaluation of right bundle branch block at the request of Clayborne Dana, NP.  Past medical history significant for right bundle branch block apparently diagnosed a year ago after that she had echocardiogram done which showed basically normal heart except aorta seems to be measuring 40 mm.  She is worried and concerned about this and she would like to be evaluated.  Overall she seems to be doing well sadly she still continues to smoke about 1 pack/week.  She does exercise on regular basis but she intends to do that.  Does have some family history of heart issue but not premature.  Not on any special diet.  Denies have any chest pain tightness squeezing pressure burning chest chest fatigue tiredness and some shortness of breath with exertion  Past Medical History:  Diagnosis Date   Anxiety and depression    Class 1 obesity without serious comorbidity with body mass index (BMI) of 33.0 to 33.9 in adult 07/14/2022   Colon polyps 02/25/2014   Epigastric pain 10/04/2022   Gastric erosions 11/19/2013   H. pylori infection 10/04/2022   History of colon polyps 10/04/2022   Hyperlipidemia    Insomnia 10/25/2012   Mixed hyperlipidemia 07/14/2022   OSA (obstructive sleep apnea) 03/03/2022   Pain in joints 10/25/2012   Prediabetes 07/14/2022   Preventative health care 05/22/2014   Right bundle branch block    S/P hysterectomy 07/02/2015   Formatting of this note might be different from the original. 2009   Seasonal allergies 10/25/2012   Sinusitis 12/26/2012   Last Assessment & Plan: Formatting of this note might be different from the  original. Flu test neg. Humidify bedroom at night. Guaifenesin 600mg  bid with 8 oz water prn to help loosen secretions. Rest and increase fluids. Start zithromax x 10 days. Pt has ssx of OSA.  Have suggested that she have a sleep study to evaluate this, but she refuses. Will need to cut back on nasal decongested.   Snoring 10/25/2012   Last Assessment & Plan: Formatting of this note might be different from the original. Have suggested that she go for a sleep study -- but she has deferred.  Husband is encouraging her to go ahead with this.   Vitamin D deficiency     Past Surgical History:  Procedure Laterality Date   ABDOMINAL HYSTERECTOMY     Cyst removed form R ovaries      left knee athroscopy      Current Medications: Current Meds  Medication Sig   COLLAGEN PO Take 1 Scoop by mouth daily. Mix with fluids   Docusate Calcium (STOOL SOFTENER PO) Take 1 tablet by mouth as needed (Constipation).   Multiple Vitamin (MULTIVITAMIN ADULT PO) Take 1 tablet by mouth daily.   Omega-3 Fatty Acids (FISH OIL) 1000 MG CAPS Take 1 capsule by mouth daily.   PARoxetine (PAXIL) 30 MG tablet Take 1 tablet (30 mg total) by mouth daily.   simvastatin (ZOCOR) 40 MG tablet Take 1 tablet (40 mg total) by mouth at bedtime.   traZODone (DESYREL) 50  MG tablet Take 0.5-1 tablets (25-50 mg total) by mouth at bedtime as needed for sleep.   Vitamin D, Ergocalciferol, (DRISDOL) 1.25 MG (50000 UNIT) CAPS capsule TAKE 1 CAPSULE BY MOUTH ONCE A WEEK. SCHEDULE LABS BEFORE NEXT REFILL. (Patient taking differently: Take 50,000 Units by mouth every 7 (seven) days.)   [DISCONTINUED] pantoprazole (PROTONIX) 40 MG tablet Take 1 tablet (40 mg total) by mouth 2 (two) times daily for 14 days.     Allergies:   Other, Penicillins, and Sulfa antibiotics   Social History   Socioeconomic History   Marital status: Divorced    Spouse name: Not on file   Number of children: Not on file   Years of education: Not on file   Highest  education level: Not on file  Occupational History   Not on file  Tobacco Use   Smoking status: Some Days    Types: Cigarettes   Smokeless tobacco: Never   Tobacco comments:    Smoking 1-2 cigarettes per day.  06/06/22 hfb  Vaping Use   Vaping Use: Never used  Substance and Sexual Activity   Alcohol use: Yes   Drug use: Yes    Types: Other-see comments    Comment: CBD gummies to help with sleep   Sexual activity: Not Currently  Other Topics Concern   Not on file  Social History Narrative   Not on file   Social Determinants of Health   Financial Resource Strain: Not on file  Food Insecurity: Not on file  Transportation Needs: Not on file  Physical Activity: Not on file  Stress: Not on file  Social Connections: Not on file     Family History: The patient's family history includes Hyperlipidemia in her father and mother; Hypertension in her father, maternal grandmother, and mother. ROS:   Please see the history of present illness.    All 14 point review of systems negative except as described per history of present illness.  EKGs/Labs/Other Studies Reviewed:    The following studies were reviewed today: Echocardiogram from year ago reviewed.  Show basically normal heart except enlargement of the aorta measuring 40 mm  EKG:  EKG is  ordered today.  The ekg ordered today demonstrates normal sinus rhythm, normal P interval normal QS complex duration fulgent no ST segment changes  Recent Labs: 01/26/2023: ALT 20; BUN 11; Creatinine, Ser 0.93; Hemoglobin 15.6; Platelets 170.0; Potassium 4.9; Sodium 140; TSH 1.93  Recent Lipid Panel    Component Value Date/Time   CHOL 221 (H) 01/26/2023 1034   TRIG 122.0 01/26/2023 1034   HDL 71.00 01/26/2023 1034   CHOLHDL 3 01/26/2023 1034   VLDL 24.4 01/26/2023 1034   LDLCALC 126 (H) 01/26/2023 1034    Physical Exam:    VS:  BP 120/78 (BP Location: Left Arm, Patient Position: Sitting)   Pulse 87   Ht 5\' 6"  (1.676 m)   Wt 209 lb  (94.8 kg)   SpO2 97%   BMI 33.73 kg/m     Wt Readings from Last 3 Encounters:  03/26/23 209 lb (94.8 kg)  01/26/23 207 lb (93.9 kg)  07/14/22 204 lb 3.2 oz (92.6 kg)     GEN:  Well nourished, well developed in no acute distress HEENT: Normal NECK: No JVD; No carotid bruits LYMPHATICS: No lymphadenopathy CARDIAC: RRR, no murmurs, no rubs, no gallops RESPIRATORY:  Clear to auscultation without rales, wheezing or rhonchi  ABDOMEN: Soft, non-tender, non-distended MUSCULOSKELETAL:  No edema; No deformity  SKIN: Warm and  dry NEUROLOGIC:  Alert and oriented x 3 PSYCHIATRIC:  Normal affect   ASSESSMENT:    1. Dyspnea on exertion   2. Right bundle branch block   3. OSA (obstructive sleep apnea)   4. Prediabetes   5. Mixed hyperlipidemia    PLAN:    In order of problems listed above:  Right bundle branch block I do not see that on today's EKG, I did have 1 EKG in the chart I do not see any right bundle branch block on that EKG.  I suspect this is a transient right bundle branch block may be rate related all may be related to obstructive sleep apnea increased pressure during the night.  Which also if sleep apnea is better managed may go away.  Still she does have to have another echocardiogram mostly for the reason of enlargement of the aortic root.  So we will schedule her to have it done. Multiple risk factors for coronary artery disease including dyslipidemia.  I did review her K PN which show me her LDL of 126 HDL 71 she has been put on simvastatin however simvastatin has been increased recently based on results of this cholesterol profile.  As a part of evaluation I will ask her to have calcium score done and based on that we decide if we need to augment her medical therapy. Prediabetes we did talk about need to exercise on the regular basis which she is determined to do.  I recommend that 5 times a week at least 30 minutes moderate intensity exercise.  He does have 3 dogs I  recommended to walk with those dogs. Smoking obviously huge problem spent at least 5 minutes talking to about this that give her different needs help to quit. Obstructive sleep apnea.  She use CPAP mask and she is very happy with that.   Medication Adjustments/Labs and Tests Ordered: Current medicines are reviewed at length with the patient today.  Concerns regarding medicines are outlined above.  Orders Placed This Encounter  Procedures   CT CARDIAC SCORING (SELF PAY ONLY)   EKG 12-Lead   ECHOCARDIOGRAM COMPLETE   No orders of the defined types were placed in this encounter.   Signed, Georgeanna Lea, MD, [facility]. 03/26/2023 3:20 PM    [facility]

## 2023-03-26 NOTE — Telephone Encounter (Signed)
Her health maintenance is marked for a mammogram every 2 years. I pended a mammogram order if you would like her to have one this year. Thanks.

## 2023-03-26 NOTE — Patient Instructions (Signed)
Medication Instructions:  Your physician recommends that you continue on your current medications as directed. Please refer to the Current Medication list given to you today.  *If you need a refill on your cardiac medications before your next appointment, please call your pharmacy*   Lab Work: None Ordered If you have labs (blood work) drawn today and your tests are completely normal, you will receive your results only by: MyChart Message (if you have MyChart) OR A paper copy in the mail If you have any lab test that is abnormal or we need to change your treatment, we will call you to review the results.   Testing/Procedures: We will order CT coronary calcium score. It will cost $99.00 and is not covered by insurance.  Please call to schedule.      [facility] [address] [place], [place] [address] [phone]     Your physician has requested that you have an echocardiogram. Echocardiography is a painless test that uses sound waves to create images of your heart. It provides your doctor with information about the size and shape of your heart and how well your heart's chambers and valves are working. This procedure takes approximately one hour. There are no restrictions for this procedure. Please do NOT wear cologne, perfume, aftershave, or lotions (deodorant is allowed). Please arrive 15 minutes prior to your appointment time.   Follow-Up: At [facility], you and your health needs are our priority.  As part of our continuing mission to provide you with exceptional heart care, we have created designated Provider Care Teams.  These Care Teams include your primary Cardiologist (physician) and Advanced Practice Providers (APPs -  Physician Assistants and Nurse Practitioners) who all work together to provide you with the care you need, when you need it.  We recommend signing up for the patient portal called "MyChart".  Sign up information is provided on this After  Visit Summary.  MyChart is used to connect with patients for Virtual Visits (Telemedicine).  Patients are able to view lab/test results, encounter notes, upcoming appointments, etc.  Non-urgent messages can be sent to your provider as well.   To learn more about what you can do with MyChart, go to [email or web address].    Your next appointment:   2 month(s)  The format for your next appointment:   In Person  Provider:   Gypsy Balsam, MD    Other Instructions NA

## 2023-04-01 NOTE — Progress Notes (Unsigned)
Virtual Video Visit via MyChart Note  I connected with  Colleen Glass on 04/01/23 at  1:40 PM EDT by the video enabled telemedicine application for MyChart, and verified that I am speaking with the correct person using two identifiers.   I introduced myself as a Publishing rights manager with the practice. We discussed the limitations of evaluation and management by telemedicine and the availability of in person appointments. The patient expressed understanding and agreed to proceed.  Participating parties in this visit include: The patient and the nurse practitioner listed. *** The patient is: At home I am: In the office - [facility]  Subjective:    CC: No chief complaint on file.   HPI: Colleen Glass is a 52 y.o. year old female presenting today via MyChart today for ***.   Mood follow-up: - Diagnosis: - Treatment: Paxil 30 mg daily, counseling  - Medication side effects:  - SI/HI:  - Update:    Past medical history, Surgical history, Family history not pertinant except as noted below, Social history, Allergies, and medications have been entered into the medical record, reviewed, and corrections made.   Review of Systems:  All review of systems negative except what is listed in the HPI   Objective:    General:  Speaking clearly in complete sentences. {(BH) RANGE ABSENT/SEVERE:20013} shortness of breath noted.   Alert and oriented x3.   Normal judgment.  {ABSENTORPRESENT:304960286} acute distress.   Impression and Recommendations:    There are no diagnoses linked to this encounter.    Follow-up if symptoms worsen or fail to improve.    I discussed the assessment and treatment plan with the patient. The patient was provided an opportunity to ask questions and all were answered. The patient agreed with the plan and demonstrated an understanding of the instructions.   The patient was advised to call back or seek an in-person  evaluation if the symptoms worsen or if the condition fails to improve as anticipated.  I spent *** minutes dedicated to the care of this patient on the date of this encounter to include pre-visit chart review of prior notes and results, face-to-face time with the patient, and post-visit ordering of testing as indicated.   Clayborne Dana, NP

## 2023-04-02 ENCOUNTER — Encounter: Payer: Self-pay | Admitting: Family Medicine

## 2023-04-02 ENCOUNTER — Telehealth (INDEPENDENT_AMBULATORY_CARE_PROVIDER_SITE_OTHER): Payer: BC Managed Care – PPO | Admitting: Family Medicine

## 2023-04-02 DIAGNOSIS — F32A Depression, unspecified: Secondary | ICD-10-CM | POA: Diagnosis not present

## 2023-04-02 DIAGNOSIS — G4709 Other insomnia: Secondary | ICD-10-CM

## 2023-04-02 DIAGNOSIS — F419 Anxiety disorder, unspecified: Secondary | ICD-10-CM | POA: Diagnosis not present

## 2023-04-02 MED ORDER — TRAZODONE HCL 50 MG PO TABS
50.0000 mg | ORAL_TABLET | Freq: Every evening | ORAL | 0 refills | Status: DC | PRN
Start: 2023-04-02 — End: 2023-06-28

## 2023-04-02 NOTE — Assessment & Plan Note (Signed)
No SI/HI Well controlled on Paxil 30 mg daily Continue counseling

## 2023-04-02 NOTE — Assessment & Plan Note (Signed)
Doing well on 1-1.25 tabs trazodone. No new concerns. Refill needed.

## 2023-04-16 ENCOUNTER — Other Ambulatory Visit: Payer: Self-pay | Admitting: Family Medicine

## 2023-04-16 DIAGNOSIS — E782 Mixed hyperlipidemia: Secondary | ICD-10-CM

## 2023-04-19 ENCOUNTER — Ambulatory Visit (HOSPITAL_BASED_OUTPATIENT_CLINIC_OR_DEPARTMENT_OTHER): Payer: BC Managed Care – PPO

## 2023-04-19 ENCOUNTER — Ambulatory Visit (HOSPITAL_BASED_OUTPATIENT_CLINIC_OR_DEPARTMENT_OTHER)
Admission: RE | Admit: 2023-04-19 | Discharge: 2023-04-19 | Disposition: A | Discharge status: Home / Self Care | Payer: BC Managed Care – PPO | Source: Ambulatory Visit | Attending: Cardiology | Admitting: Cardiology

## 2023-04-19 ENCOUNTER — Encounter (HOSPITAL_BASED_OUTPATIENT_CLINIC_OR_DEPARTMENT_OTHER): Payer: Self-pay

## 2023-04-19 ENCOUNTER — Ambulatory Visit (HOSPITAL_BASED_OUTPATIENT_CLINIC_OR_DEPARTMENT_OTHER)
Admission: RE | Admit: 2023-04-19 | Discharge: 2023-04-19 | Disposition: A | Discharge status: Home / Self Care | Payer: BC Managed Care – PPO | Source: Ambulatory Visit | Attending: Family Medicine | Admitting: Family Medicine

## 2023-04-19 DIAGNOSIS — R0609 Other forms of dyspnea: Secondary | ICD-10-CM | POA: Diagnosis present

## 2023-04-19 DIAGNOSIS — Z1231 Encounter for screening mammogram for malignant neoplasm of breast: Secondary | ICD-10-CM | POA: Diagnosis present

## 2023-04-19 LAB — ECHOCARDIOGRAM COMPLETE
Area-P 1/2: 6.54 cm2
S' Lateral: 2.7 cm

## 2023-04-27 ENCOUNTER — Other Ambulatory Visit (HOSPITAL_BASED_OUTPATIENT_CLINIC_OR_DEPARTMENT_OTHER): Payer: BC Managed Care – PPO

## 2023-04-30 ENCOUNTER — Telehealth: Payer: Self-pay | Admitting: Cardiology

## 2023-04-30 NOTE — Telephone Encounter (Signed)
Patient returned call for her echo results.  

## 2023-04-30 NOTE — Telephone Encounter (Signed)
-----   Message from Georgeanna Lea, MD sent at 04/25/2023  9:50 AM EDT ----- Echocardiogram showed preserved left ventricle ejection fraction mild concentric LVH which means increased thickening of the wall of the heart.  But overall looks fine continue present management

## 2023-04-30 NOTE — Telephone Encounter (Signed)
Results reviewed with pt as per Dr. Krasowski's note.  Pt verbalized understanding and had no additional questions. Routed to PCP  

## 2023-05-04 ENCOUNTER — Ambulatory Visit (HOSPITAL_BASED_OUTPATIENT_CLINIC_OR_DEPARTMENT_OTHER)
Admission: RE | Admit: 2023-05-04 | Discharge: 2023-05-04 | Disposition: A | Discharge status: Home / Self Care | Payer: BC Managed Care – PPO | Source: Ambulatory Visit | Attending: Cardiology | Admitting: Cardiology

## 2023-05-04 DIAGNOSIS — R0609 Other forms of dyspnea: Secondary | ICD-10-CM | POA: Insufficient documentation

## 2023-05-11 ENCOUNTER — Telehealth: Payer: Self-pay

## 2023-05-11 DIAGNOSIS — I4510 Unspecified right bundle-branch block: Secondary | ICD-10-CM

## 2023-05-11 DIAGNOSIS — E782 Mixed hyperlipidemia: Secondary | ICD-10-CM

## 2023-05-11 NOTE — Telephone Encounter (Signed)
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

## 2023-05-15 NOTE — Telephone Encounter (Signed)
Patient notified through my chart, order placed on file

## 2023-05-19 ENCOUNTER — Encounter: Payer: Self-pay | Admitting: Family Medicine

## 2023-06-05 ENCOUNTER — Ambulatory Visit: Payer: BC Managed Care – PPO | Attending: Cardiology | Admitting: Cardiology

## 2023-06-05 ENCOUNTER — Encounter: Payer: Self-pay | Admitting: Cardiology

## 2023-06-05 VITALS — BP 110/74 | HR 77 | Ht 66.0 in | Wt 209.0 lb

## 2023-06-05 DIAGNOSIS — R072 Precordial pain: Secondary | ICD-10-CM | POA: Diagnosis not present

## 2023-06-05 DIAGNOSIS — E782 Mixed hyperlipidemia: Secondary | ICD-10-CM

## 2023-06-05 DIAGNOSIS — G4733 Obstructive sleep apnea (adult) (pediatric): Secondary | ICD-10-CM | POA: Diagnosis not present

## 2023-06-05 DIAGNOSIS — I714 Abdominal aortic aneurysm, without rupture, unspecified: Secondary | ICD-10-CM

## 2023-06-05 DIAGNOSIS — I7121 Aneurysm of the ascending aorta, without rupture: Secondary | ICD-10-CM

## 2023-06-05 DIAGNOSIS — I4510 Unspecified right bundle-branch block: Secondary | ICD-10-CM

## 2023-06-05 NOTE — Progress Notes (Unsigned)
Cardiology Office Note:    Date:  06/05/2023   ID:  Colleen Glass, DOB 02/07/1971, MRN [id number]  PCP:  Clayborne Dana, NP  Cardiologist:  Gypsy Balsam, MD    Referring MD: Clayborne Dana, NP   Chief Complaint  Patient presents with   Follow-up  Doing fine  History of Present Illness:    Colleen Glass is a 52 y.o. 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  She comes today to months for follow-up overall cardiac wise seems to be doing well.  Denies have any chest pain tightness squeezing pressure burning chest.  Obviously she is terrified by defibrillator and ulcer seems to be a little larger however explained to her that this is completely different technique to look at the aneurysm.  We just need to follow this very carefully.  Past Medical History:  Diagnosis Date   Anxiety and depression    Class 1 obesity without serious comorbidity with body mass index (BMI) of 33.0 to 33.9 in adult 07/14/2022   Colon polyps 02/25/2014   Epigastric pain 10/04/2022   Gastric erosions 11/19/2013   H. pylori infection 10/04/2022   History of colon polyps 10/04/2022   Hyperlipidemia    Insomnia 10/25/2012   Mixed hyperlipidemia 07/14/2022   OSA (obstructive sleep apnea) 03/03/2022   Pain in joints 10/25/2012   Prediabetes 07/14/2022   Preventative health care 05/22/2014   Right bundle branch block    S/P hysterectomy 07/02/2015   Formatting of this note might be different from the original. 2009   Seasonal allergies 10/25/2012   Sinusitis 12/26/2012   Last Assessment & Plan: Formatting of this note might be different from the original. Flu test neg. Humidify bedroom at night. Guaifenesin 600mg  bid with 8 oz water prn to help loosen secretions. Rest and increase fluids. Start  zithromax x 10 days. Pt has ssx of OSA.  Have suggested that she have a sleep study to evaluate this, but she refuses. Will need to cut back on nasal decongested.   Snoring 10/25/2012   Last Assessment & Plan: Formatting of this note might be different from the original. Have suggested that she go for a sleep study -- but she has deferred.  Husband is encouraging her to go ahead with this.   Vitamin D deficiency     Past Surgical History:  Procedure Laterality Date   ABDOMINAL HYSTERECTOMY     Cyst removed form R ovaries      left knee athroscopy      Current Medications: Current Meds  Medication Sig   COLLAGEN PO Take 1 Scoop by mouth daily. Mix with fluids   Docusate Calcium (STOOL SOFTENER PO) Take 1 tablet by mouth as needed (Constipation).   metFORMIN (GLUCOPHAGE) 500 MG tablet Take 500 mg by mouth daily.   Multiple Vitamin (MULTIVITAMIN ADULT PO) Take 1 tablet by mouth daily.   Omega-3 Fatty Acids (FISH OIL) 1000 MG CAPS Take 1 capsule by mouth daily.   PARoxetine (PAXIL) 30 MG tablet Take 1 tablet (30 mg total) by mouth daily. (Patient taking differently: Take 40 mg by mouth daily.)   simvastatin (ZOCOR) 40 MG tablet Take 1 tablet (40 mg total) by mouth at bedtime.   traZODone (DESYREL) 50 MG tablet Take 1-1.5 tablets (50-75 mg total) by mouth at bedtime as needed for sleep.  Vitamin D, Ergocalciferol, (DRISDOL) 1.25 MG (50000 UNIT) CAPS capsule TAKE 1 CAPSULE BY MOUTH ONCE A WEEK. SCHEDULE LABS BEFORE NEXT REFILL. (Patient taking differently: Take 50,000 Units by mouth every 7 (seven) days.)     Allergies:   Other, Penicillins, and Sulfa antibiotics   Social History   Socioeconomic History   Marital status: Divorced    Spouse name: Not on file   Number of children: Not on file   Years of education: Not on file   Highest education level: Not on file  Occupational History   Not on file  Tobacco Use   Smoking status: Some Days    Types: Cigarettes   Smokeless tobacco:  Never   Tobacco comments:    Smoking 1-2 cigarettes per day.  06/06/22 hfb  Vaping Use   Vaping Use: Never used  Substance and Sexual Activity   Alcohol use: Yes   Drug use: Yes    Types: Other-see comments    Comment: CBD gummies to help with sleep   Sexual activity: Not Currently  Other Topics Concern   Not on file  Social History Narrative   Not on file   Social Determinants of Health   Financial Resource Strain: Not on file  Food Insecurity: Not on file  Transportation Needs: Not on file  Physical Activity: Not on file  Stress: Not on file  Social Connections: Not on file     Family History: The patient's family history includes Hyperlipidemia in her father and mother; Hypertension in her father, maternal grandmother, and mother. ROS:   Please see the history of present illness.    All 14 point review of systems negative except as described per history of present illness  EKGs/Labs/Other Studies Reviewed:      Recent Labs: 01/26/2023: ALT 20; BUN 11; Creatinine, Ser 0.93; Hemoglobin 15.6; Platelets 170.0; Potassium 4.9; Sodium 140; TSH 1.93  Recent Lipid Panel    Component Value Date/Time   CHOL 221 (H) 01/26/2023 1034   TRIG 122.0 01/26/2023 1034   HDL 71.00 01/26/2023 1034   CHOLHDL 3 01/26/2023 1034   VLDL 24.4 01/26/2023 1034   LDLCALC 126 (H) 01/26/2023 1034    Physical Exam:    VS:  BP 110/74 (BP Location: Left Arm, Patient Position: Sitting)   Pulse 77   Ht 5\' 6"  (1.676 m)   Wt 209 lb (94.8 kg)   SpO2 94%   BMI 33.73 kg/m     Wt Readings from Last 3 Encounters:  06/05/23 209 lb (94.8 kg)  03/26/23 209 lb (94.8 kg)  01/26/23 207 lb (93.9 kg)     GEN:  Well nourished, well developed in no acute distress HEENT: Normal NECK: No JVD; No carotid bruits LYMPHATICS: No lymphadenopathy CARDIAC: RRR, no murmurs, no rubs, no gallops RESPIRATORY:  Clear to auscultation without rales, wheezing or rhonchi  ABDOMEN: Soft, non-tender,  non-distended MUSCULOSKELETAL:  No edema; No deformity  SKIN: Warm and dry LOWER EXTREMITIES: no swelling NEUROLOGIC:  Alert and oriented x 3 PSYCHIATRIC:  Normal affect   ASSESSMENT:    1. Precordial pain   2. Abdominal aortic aneurysm (AAA) without rupture, unspecified part (HCC)   3. Aneurysm of ascending aorta without rupture (HCC)   4. OSA (obstructive sleep apnea)   5. Right bundle branch block   6. Mixed hyperlipidemia    PLAN:    In order of problems listed above:  Precordial chest pain denies having any. Ascending arctic aneurysm measuring 45 based on CT which  was not gated.  Will schedule her in 6 months to have gated CT of the chest to look precisely at the diameter of her aorta.  I will also schedule him to have abdominal ultrasound to make sure there is no aneurysm in the abdominal area.  She does not have any family history of there is no family history of sudden cardiac death. Obstructive sleep apnea use CPAP mask on the regular basis which I encouraged her to continue. Right bundle branch block which is chronic. Dyslipidemia I did review K PN which show me her LDL of 126 HDL 71.  Her calcium score is 0.  Will continue monitoring   Medication Adjustments/Labs and Tests Ordered: Current medicines are reviewed at length with the patient today.  Concerns regarding medicines are outlined above.  Orders Placed This Encounter  Procedures   CT ANGIO CHEST AORTA W/ & OR WO/CM & GATING ([place] ONLY)   Basic metabolic panel   VAS [place] AAA DUPLEX   Medication changes: No orders of the defined types were placed in this encounter.   Signed, Georgeanna Lea, MD, [facility] 06/05/2023 12:08 PM    [facility]

## 2023-06-05 NOTE — Patient Instructions (Addendum)
Medication Instructions:  Your physician recommends that you continue on your current medications as directed. Please refer to the Current Medication list given to you today.  *If you need a refill on your cardiac medications before your next appointment, please call your pharmacy*   Lab Work: 3rd Floor  [address] If you have labs (blood work) drawn today and your tests are completely normal, you will receive your results only by: MyChart Message (if you have MyChart) OR A paper copy in the mail If you have any lab test that is abnormal or we need to change your treatment, we will call you to review the results.   Testing/Procedures: Your physician has requested that you have an abdominal aorta duplex. During this test, an ultrasound is used to evaluate the aorta. Allow 30 minutes for this exam. Do not eat after midnight the day before and avoid carbonated beverages   Non-Cardiac CT scanning, (CAT scanning), is a noninvasive, special x-ray that produces cross-sectional images of the body using x-rays and a computer. CT scans help physicians diagnose and treat medical conditions. For some CT exams, a contrast material is used to enhance visibility in the area of the body being studied. CT scans provide greater clarity and reveal more details than regular x-ray exams.    Follow-Up: At [facility], you and your health needs are our priority.  As part of our continuing mission to provide you with exceptional heart care, we have created designated Provider Care Teams.  These Care Teams include your primary Cardiologist (physician) and Advanced Practice Providers (APPs -  Physician Assistants and Nurse Practitioners) who all work together to provide you with the care you need, when you need it.  We recommend signing up for the patient portal called "MyChart".  Sign up information is provided on this After Visit Summary.  MyChart is used to connect with patients for Virtual Visits (Telemedicine).   Patients are able to view lab/test results, encounter notes, upcoming appointments, etc.  Non-urgent messages can be sent to your provider as well.   To learn more about what you can do with MyChart, go to [email or web address].    Your next appointment:   6 month(s)  The format for your next appointment:   In Person  Provider:   Gypsy Balsam, MD    Other Instructions NA

## 2023-06-09 ENCOUNTER — Encounter: Payer: Self-pay | Admitting: Family Medicine

## 2023-06-09 DIAGNOSIS — G4700 Insomnia, unspecified: Secondary | ICD-10-CM

## 2023-06-09 DIAGNOSIS — A6000 Herpesviral infection of urogenital system, unspecified: Secondary | ICD-10-CM

## 2023-06-11 MED ORDER — VALACYCLOVIR HCL 500 MG PO TABS
500.0000 mg | ORAL_TABLET | Freq: Two times a day (BID) | ORAL | 3 refills | Status: AC | PRN
Start: 2023-06-11 — End: ?

## 2023-06-11 NOTE — Addendum Note (Signed)
Addended by: Hyman Hopes B on: 06/11/2023 01:20 PM   Modules accepted: Orders

## 2023-06-20 MED ORDER — RAMELTEON 8 MG PO TABS
8.0000 mg | ORAL_TABLET | Freq: Every day | ORAL | 0 refills | Status: DC
Start: 2023-06-20 — End: 2023-06-28

## 2023-06-20 NOTE — Addendum Note (Signed)
Addended by: Hyman Hopes B on: 06/20/2023 04:46 PM   Modules accepted: Orders

## 2023-06-24 ENCOUNTER — Encounter: Payer: Self-pay | Admitting: Family Medicine

## 2023-06-24 DIAGNOSIS — G4700 Insomnia, unspecified: Secondary | ICD-10-CM

## 2023-06-25 NOTE — Telephone Encounter (Signed)
If she would like to try it, looks like it should be about $35 at [facility] with [facility] [id number] PCN [employer] [id number] Member ID [id number]

## 2023-06-25 NOTE — Telephone Encounter (Signed)
I called the pharmacy and they stated that the Rx isnt covered.

## 2023-06-27 ENCOUNTER — Encounter: Payer: Self-pay | Admitting: Family Medicine

## 2023-06-27 DIAGNOSIS — F5101 Primary insomnia: Secondary | ICD-10-CM

## 2023-06-28 MED ORDER — ESZOPICLONE 1 MG PO TABS
1.0000 mg | ORAL_TABLET | Freq: Every evening | ORAL | 0 refills | Status: DC | PRN
Start: 2023-06-28 — End: 2023-10-09

## 2023-06-30 ENCOUNTER — Encounter: Payer: Self-pay | Admitting: Family Medicine

## 2023-07-02 ENCOUNTER — Ambulatory Visit (HOSPITAL_COMMUNITY): Payer: BC Managed Care – PPO

## 2023-07-02 ENCOUNTER — Other Ambulatory Visit: Payer: Self-pay | Admitting: Family Medicine

## 2023-07-02 DIAGNOSIS — E559 Vitamin D deficiency, unspecified: Secondary | ICD-10-CM

## 2023-07-02 MED ORDER — VITAMIN D3 25 MCG (1000 UT) PO CAPS
1000.0000 [IU] | ORAL_CAPSULE | Freq: Every day | ORAL | 3 refills | Status: DC
Start: 2023-07-02 — End: 2024-01-24

## 2023-07-02 NOTE — Telephone Encounter (Signed)
Do you want her on the prescription?

## 2023-07-23 ENCOUNTER — Other Ambulatory Visit: Payer: Self-pay | Admitting: Family Medicine

## 2023-07-23 DIAGNOSIS — F419 Anxiety disorder, unspecified: Secondary | ICD-10-CM

## 2023-07-31 ENCOUNTER — Ambulatory Visit (HOSPITAL_COMMUNITY)
Admission: RE | Admit: 2023-07-31 | Payer: BC Managed Care – PPO | Source: Ambulatory Visit | Attending: Cardiology | Admitting: Cardiology

## 2023-08-19 ENCOUNTER — Encounter: Payer: Self-pay | Admitting: Family Medicine

## 2023-08-21 ENCOUNTER — Other Ambulatory Visit: Payer: Self-pay | Admitting: Family Medicine

## 2023-08-21 DIAGNOSIS — E782 Mixed hyperlipidemia: Secondary | ICD-10-CM

## 2023-09-16 IMAGING — DX DG FINGER THUMB 2+V*L*
3 series · 3 of 3 positions shown · non-contrast
Comparison: None.

CLINICAL DATA: Left thumb pain

EXAM:
LEFT THUMB 2+V

[finger pa]
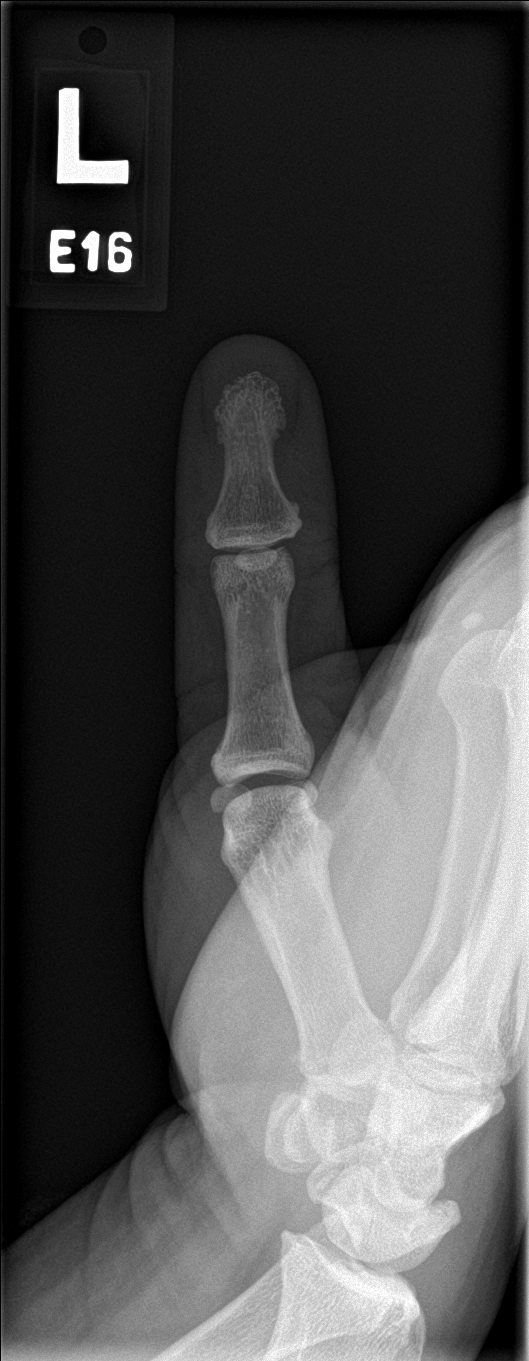

[finger obl]
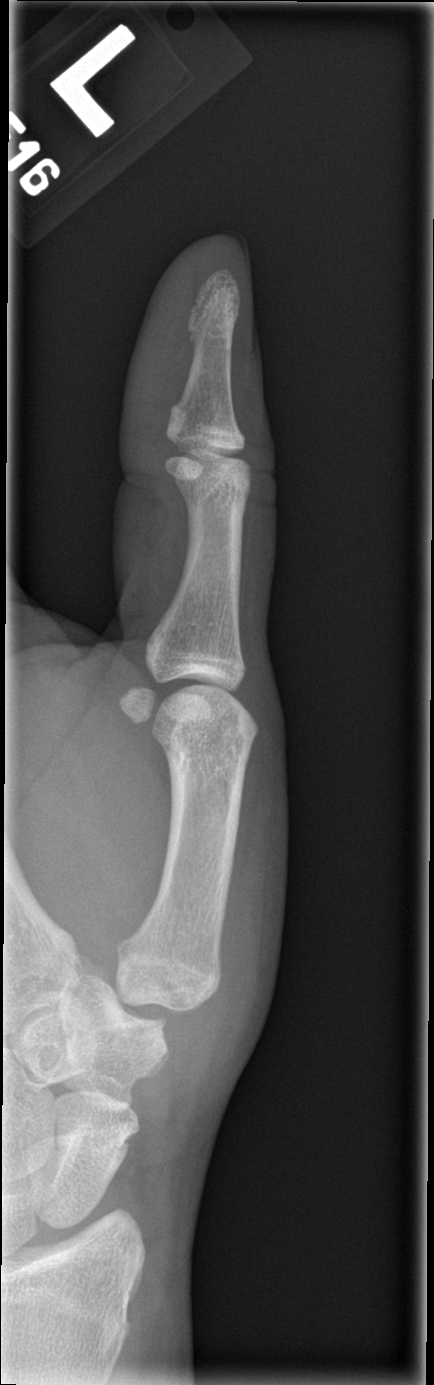

[finger lat]
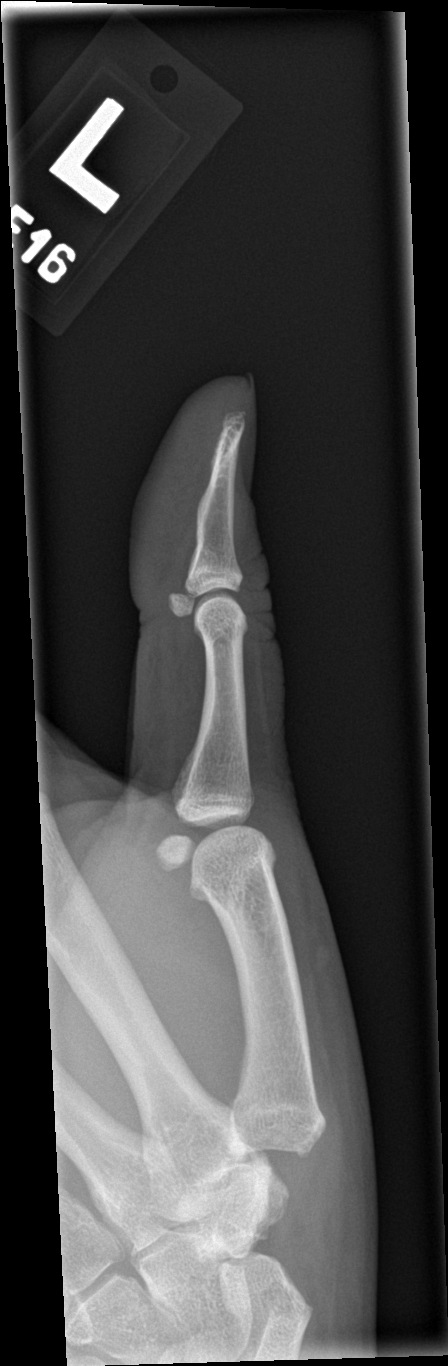

[3 of 3 positions shown; findings below may reference images not displayed]

FINDINGS: Normal alignment. No acute fracture or dislocation. Mild
degenerative arthritis at the first CMC joint at the base of the
thumb. Remaining joint spaces are preserved. Soft tissues are
unremarkable.
IMPRESSION: No acute fracture or dislocation.

## 2023-09-25 ENCOUNTER — Encounter: Payer: Self-pay | Admitting: Family Medicine

## 2023-09-27 LAB — BASIC METABOLIC PANEL
BUN/Creatinine Ratio: 5 — ABNORMAL LOW (ref 9–23)
BUN: 5 mg/dL — ABNORMAL LOW (ref 6–24)
CO2: 25 mmol/L (ref 20–29)
Calcium: 10.2 mg/dL (ref 8.7–10.2)
Chloride: 102 mmol/L (ref 96–106)
Creatinine, Ser: 0.99 mg/dL (ref 0.57–1.00)
Glucose: 87 mg/dL (ref 70–99)
Potassium: 4.9 mmol/L (ref 3.5–5.2)
Sodium: 143 mmol/L (ref 134–144)
eGFR: 68 mL/min/{1.73_m2} (ref >59)

## 2023-10-01 ENCOUNTER — Ambulatory Visit (HOSPITAL_COMMUNITY)
Admission: RE | Admit: 2023-10-01 | Discharge: 2023-10-01 | Disposition: A | Discharge status: Home / Self Care | Payer: BC Managed Care – PPO | Source: Ambulatory Visit | Attending: Cardiology | Admitting: Cardiology

## 2023-10-01 DIAGNOSIS — I714 Abdominal aortic aneurysm, without rupture, unspecified: Secondary | ICD-10-CM

## 2023-10-08 ENCOUNTER — Ambulatory Visit (HOSPITAL_COMMUNITY)
Admission: RE | Admit: 2023-10-08 | Discharge: 2023-10-08 | Disposition: A | Discharge status: Home / Self Care | Payer: BC Managed Care – PPO | Source: Ambulatory Visit | Attending: Cardiology | Admitting: Cardiology

## 2023-10-08 DIAGNOSIS — I714 Abdominal aortic aneurysm, without rupture, unspecified: Secondary | ICD-10-CM | POA: Insufficient documentation

## 2023-10-08 DIAGNOSIS — I7121 Aneurysm of the ascending aorta, without rupture: Secondary | ICD-10-CM | POA: Insufficient documentation

## 2023-10-09 ENCOUNTER — Telehealth: Payer: Self-pay

## 2023-10-09 ENCOUNTER — Encounter (INDEPENDENT_AMBULATORY_CARE_PROVIDER_SITE_OTHER): Payer: BC Managed Care – PPO | Admitting: Family Medicine

## 2023-10-09 DIAGNOSIS — G4700 Insomnia, unspecified: Secondary | ICD-10-CM

## 2023-10-09 MED ORDER — ZOLPIDEM TARTRATE 5 MG PO TABS
5.0000 mg | ORAL_TABLET | Freq: Every evening | ORAL | 0 refills | Status: DC | PRN
Start: 2023-10-09 — End: 2023-11-23

## 2023-10-09 NOTE — Telephone Encounter (Signed)
Please see the MyChart message reply(ies) for my assessment and plan.    This patient gave consent for this Medical Advice Message and is aware that it may result in a bill to their insurance company, as well as the possibility of receiving a bill for a co-payment or deductible. They are an established patient, but are not seeking medical advice exclusively about a problem treated during an in person or video visit in the last seven days. I did not recommend an in person or video visit within seven days of my reply.    I spent a total of 5 minutes cumulative time within 7 days through MyChart messaging.  Taylor B Beck, NP   

## 2023-10-09 NOTE — Telephone Encounter (Signed)
Left message on My Chart with normal results per Dr. Krasowski's note. Routed to PCP. 

## 2023-10-09 NOTE — Telephone Encounter (Signed)
Pt viewed results on My Chart per Dr. Krasowski's note. Routed to PCP.  

## 2023-11-05 ENCOUNTER — Ambulatory Visit (HOSPITAL_COMMUNITY)
Admission: RE | Admit: 2023-11-05 | Discharge: 2023-11-05 | Disposition: A | Discharge status: Home / Self Care | Payer: BC Managed Care – PPO | Source: Ambulatory Visit | Attending: Cardiology | Admitting: Cardiology

## 2023-11-05 DIAGNOSIS — R072 Precordial pain: Secondary | ICD-10-CM | POA: Insufficient documentation

## 2023-11-05 MED ORDER — IOHEXOL 350 MG/ML SOLN
75.0000 mL | Freq: Once | INTRAVENOUS | Status: AC | PRN
  Administered 2023-11-05: 75 mL via INTRAVENOUS

## 2023-11-12 ENCOUNTER — Telehealth: Payer: Self-pay

## 2023-11-12 NOTE — Telephone Encounter (Signed)
Patient notified through my chart.

## 2023-11-12 NOTE — Telephone Encounter (Signed)
-----   Message from Gypsy Balsam sent at 11/12/2023 10:14 AM EST ----- Chest angiogram show the same size of the aneurysm, 42 mm.  Continue monitoring

## 2023-11-20 ENCOUNTER — Other Ambulatory Visit: Payer: Self-pay | Admitting: Family Medicine

## 2023-11-20 DIAGNOSIS — G4709 Other insomnia: Secondary | ICD-10-CM

## 2023-11-20 DIAGNOSIS — G4700 Insomnia, unspecified: Secondary | ICD-10-CM

## 2023-11-20 NOTE — Telephone Encounter (Signed)
Requesting: Ambien 5mg   Contract: None UDS: None Last Visit:  04/02/23 Next Visit: None Last Refill: 10/09/23 #30 and 0RF   Please Advise

## 2023-11-21 NOTE — Telephone Encounter (Signed)
Confirmed she is taking Ambien not Trazodone.

## 2023-11-21 NOTE — Telephone Encounter (Signed)
Left message on machine for patient to call back to let us know which RX they are currently taking.

## 2023-11-23 ENCOUNTER — Encounter: Payer: Self-pay | Admitting: Family Medicine

## 2023-11-23 NOTE — Telephone Encounter (Signed)
done

## 2023-11-23 NOTE — Telephone Encounter (Signed)
I routed this back to you (yesterday I think)

## 2024-01-01 ENCOUNTER — Ambulatory Visit: Payer: BC Managed Care – PPO | Admitting: Cardiology

## 2024-01-14 ENCOUNTER — Encounter (INDEPENDENT_AMBULATORY_CARE_PROVIDER_SITE_OTHER): Payer: BC Managed Care – PPO | Admitting: Family Medicine

## 2024-01-14 ENCOUNTER — Encounter: Payer: Self-pay | Admitting: Family Medicine

## 2024-01-14 DIAGNOSIS — N3946 Mixed incontinence: Secondary | ICD-10-CM

## 2024-01-14 NOTE — Telephone Encounter (Signed)
Please see the MyChart message reply(ies) for my assessment and plan.    This patient gave consent for this Medical Advice Message and is aware that it may result in a bill to [employer], as well as the possibility of receiving a bill for a co-payment or deductible. They are an established patient, but are not seeking medical advice exclusively about a problem treated during an in person or video visit in the last seven days. I did not recommend an in person or video visit within seven days of my reply.    I spent a total of 5 minutes cumulative time within 7 days through [employer].  Clayborne Dana, NP

## 2024-01-24 ENCOUNTER — Other Ambulatory Visit: Payer: Self-pay | Admitting: Family Medicine

## 2024-01-24 ENCOUNTER — Encounter: Payer: Self-pay | Admitting: Family Medicine

## 2024-01-24 ENCOUNTER — Ambulatory Visit (INDEPENDENT_AMBULATORY_CARE_PROVIDER_SITE_OTHER): Payer: 59 | Admitting: Family Medicine

## 2024-01-24 VITALS — BP 119/75 | HR 78 | Ht 66.0 in | Wt 195.0 lb

## 2024-01-24 DIAGNOSIS — F32A Depression, unspecified: Secondary | ICD-10-CM

## 2024-01-24 DIAGNOSIS — N898 Other specified noninflammatory disorders of vagina: Secondary | ICD-10-CM

## 2024-01-24 DIAGNOSIS — G4700 Insomnia, unspecified: Secondary | ICD-10-CM

## 2024-01-24 DIAGNOSIS — E559 Vitamin D deficiency, unspecified: Secondary | ICD-10-CM

## 2024-01-24 DIAGNOSIS — E782 Mixed hyperlipidemia: Secondary | ICD-10-CM

## 2024-01-24 DIAGNOSIS — F419 Anxiety disorder, unspecified: Secondary | ICD-10-CM

## 2024-01-24 DIAGNOSIS — E538 Deficiency of other specified B group vitamins: Secondary | ICD-10-CM | POA: Diagnosis not present

## 2024-01-24 DIAGNOSIS — R7303 Prediabetes: Secondary | ICD-10-CM

## 2024-01-24 DIAGNOSIS — Z114 Encounter for screening for human immunodeficiency virus [HIV]: Secondary | ICD-10-CM

## 2024-01-24 DIAGNOSIS — Z79899 Other long term (current) drug therapy: Secondary | ICD-10-CM

## 2024-01-24 DIAGNOSIS — R1013 Epigastric pain: Secondary | ICD-10-CM

## 2024-01-24 MED ORDER — ZOLPIDEM TARTRATE 5 MG PO TABS
5.0000 mg | ORAL_TABLET | Freq: Every evening | ORAL | 0 refills | Status: DC | PRN
Start: 2024-01-24 — End: 2024-03-17

## 2024-01-24 MED ORDER — HYDROXYZINE HCL 10 MG PO TABS
10.0000 mg | ORAL_TABLET | Freq: Three times a day (TID) | ORAL | 0 refills | Status: AC | PRN
Start: 2024-01-24 — End: ?

## 2024-01-24 MED ORDER — PAROXETINE HCL 40 MG PO TABS
40.0000 mg | ORAL_TABLET | ORAL | 1 refills | Status: DC
Start: 2024-01-24 — End: 2024-07-21

## 2024-01-24 NOTE — Progress Notes (Signed)
 Established Patient Office Visit  Subjective   Patient ID: Colleen Glass, female    DOB: September 15, 1971  Age: 53 y.o. MRN: [id number]  Chief Complaint  Patient presents with   Medical Management of Chronic Issues    HPI  Discussed the use of AI scribe software for clinical note transcription with the patient, who gave verbal consent to proceed.  History of Present Illness   Colleen Glass is a 53 year old female with anxiety who presents with worsening anxiety symptoms.  She experiences worsening anxiety symptoms, feeling constantly on edge. Her current medication, Paxil  30 mg, was initially effective but now seems less so. Potential triggers include concerns about her daughter, who is bipolar, graduating from college, and the stress of her daughter moving away. Additionally, traveling alone for work induces anxiety.  She has a history of H. pylori and reports recent epigastric discomfort, described as an uncomfortable feeling that started about two months ago. She uses acid blockers for relief but is concerned about long-term use. She is considering further evaluation for H. pylori due to these symptoms.  She is postmenopausal, experiencing symptoms such as dry skin and vaginal dryness, which she has noticed since becoming intimate with a new partner after six years of being divorced. She is self-conscious about this change and is seeking advice on management options.  She experienced severe nausea and vomiting with semaglutide  and tirzepatide, leading to discontinuation. Despite this, she has lost 16 pounds over the past year. She associates recent gastrointestinal discomfort, including nausea and lack of appetite, with previous use of these weight loss medications.              01/24/2024    1:33 PM 04/02/2023    1:54 PM 01/26/2023    9:21 AM  PHQ9 SCORE ONLY  PHQ-9 Total Score 12 1 2       01/24/2024    1:33 PM 04/02/2023    1:56 PM 01/26/2023    9:25 AM 07/14/2022   10:34  AM  GAD 7 : Generalized Anxiety Score  Nervous, Anxious, on Edge 3 1 1 1   Control/stop worrying 2 1 1 1   Worry too much - different things 2 1 1 1   Trouble relaxing 2 0 0 1  Restless 1 0 0 0  Easily annoyed or irritable 2 0 1 1  Afraid - awful might happen 1 0 1 1  Total GAD 7 Score 13 3 5 6   Anxiety Difficulty Very difficult Not difficult at all Somewhat difficult Somewhat difficult         ROS All review of systems negative except what is listed in the HPI    Objective:     BP 119/75   Pulse 78   Ht 5' 6 (1.676 m)   Wt 195 lb (88.5 kg)   SpO2 99%   BMI 31.47 kg/m    Physical Exam Vitals reviewed.  Constitutional:      General: She is not in acute distress.    Appearance: Normal appearance. She is obese. She is not ill-appearing.  Cardiovascular:     Rate and Rhythm: Normal rate and regular rhythm.     Heart sounds: Normal heart sounds.  Pulmonary:     Effort: Pulmonary effort is normal.     Breath sounds: Normal breath sounds.  Skin:    General: Skin is warm and dry.  Neurological:     Mental Status: She is alert and oriented to person, place, and time.  Psychiatric:  Mood and Affect: Mood normal.        Behavior: Behavior normal.        Thought Content: Thought content normal.        Judgment: Judgment normal.        No results found for any visits on 01/24/24.    The 10-year ASCVD risk score (Arnett DK, et al., 2019) is: 3.8%    Assessment & Plan:   Problem List Items Addressed This Visit       Active Problems   Anxiety and depression - Primary   Increased anxiety symptoms despite current treatment with Paxil  30mg . Possible triggers include daughter's bipolar disorder, work stress, and travel. -Increase Paxil  to 40mg  daily. -Provide Hydroxyzine  for as-needed use for acute anxiety episodes.      Relevant Medications   PARoxetine  (PAXIL ) 40 MG tablet   hydrOXYzine  (ATARAX ) 10 MG tablet   Prediabetes   Asymptomatic.  Updating  labs today.  Discussed low-carb diet, portion control, exercise, and weight management.       Relevant Orders   CBC with Differential/Platelet   Comprehensive metabolic panel   Hemoglobin A1c   TSH   Mixed hyperlipidemia   -Reviewed most recent lipid panel -Medication management: continue simvastatin  and Omega-3 -Repeat CMP and lipid panel today -Diet low in saturated fat -Regular exercise - at least 30 minutes, 5 times per week       Relevant Orders   Comprehensive metabolic panel   Lipid panel   Vitamin D  deficiency   Rechecking labs today.       Relevant Orders   VITAMIN D  25 Hydroxy (Vit-D Deficiency, Fractures)   Insomnia   Doing well on PRN ambien . No new concerns. Refill needed.  UDS/contract today      Relevant Medications   zolpidem  (AMBIEN ) 5 MG tablet   Other Relevant Orders   Drug Monitoring Panel [phone] , Urine   Epigastric pain   History of H. Pylori with recent onset of epigastric discomfort and decreased appetite. Currently taking occasional PPI, so we cannot get accurate H. pylori testing today.  -Refer to GI for possible endoscopy and H. Pylori testing. She is going to call her previous GI provider for an appointment.  -Continue supportive measures and PPI/H2      B12 deficiency   Labs today      Relevant Orders   B12 and Folate Panel   Other Visit Diagnoses       Vaginal dryness     Ongoing menopausal symptoms including dry skin and vaginal dryness. -Recommend lubricants or coconut oil for vaginal dryness. -Consider estrogen cream if symptoms persist and become problematic.       Encounter for screening for HIV       Relevant Orders   HIV Antibody (routine testing w rflx)     High risk medication use       Relevant Medications   zolpidem  (AMBIEN ) 5 MG tablet   Other Relevant Orders   Drug Monitoring Panel [phone] , Urine       Return in about 6 weeks (around 03/06/2024) for mood f/u; 6 month routine follow-up.    Waddell KATHEE Mon,  NP

## 2024-01-24 NOTE — Patient Instructions (Signed)
 GI: [phone]

## 2024-01-25 DIAGNOSIS — E538 Deficiency of other specified B group vitamins: Secondary | ICD-10-CM | POA: Insufficient documentation

## 2024-01-25 LAB — COMPREHENSIVE METABOLIC PANEL
ALT: 37 U/L — ABNORMAL HIGH (ref 0–35)
AST: 34 U/L (ref 0–37)
Albumin: 4.5 g/dL (ref 3.5–5.2)
Alkaline Phosphatase: 64 U/L (ref 39–117)
BUN: 9 mg/dL (ref 6–23)
CO2: 28 meq/L (ref 19–32)
Calcium: 9.8 mg/dL (ref 8.4–10.5)
Chloride: 101 meq/L (ref 96–112)
Creatinine, Ser: 0.92 mg/dL (ref 0.40–1.20)
GFR: 70.36 mL/min (ref >60.00)
Glucose, Bld: 78 mg/dL (ref 70–99)
Potassium: 4.6 meq/L (ref 3.5–5.1)
Sodium: 143 meq/L (ref 135–145)
Total Bilirubin: 0.5 mg/dL (ref 0.2–1.2)
Total Protein: 7.6 g/dL (ref 6.0–8.3)

## 2024-01-25 LAB — CBC WITH DIFFERENTIAL/PLATELET
Basophils Absolute: 0.1 10*3/uL (ref 0.0–0.1)
Basophils Relative: 1.4 % (ref 0.0–3.0)
Eosinophils Absolute: 0.1 10*3/uL (ref 0.0–0.7)
Eosinophils Relative: 1.5 % (ref 0.0–5.0)
HCT: 46.8 % — ABNORMAL HIGH (ref 36.0–46.0)
Hemoglobin: 15.4 g/dL — ABNORMAL HIGH (ref 12.0–15.0)
Lymphocytes Relative: 42.2 % (ref 12.0–46.0)
Lymphs Abs: 2.3 10*3/uL (ref 0.7–4.0)
MCHC: 33 g/dL (ref 30.0–36.0)
MCV: 97.7 fL (ref 78.0–100.0)
Monocytes Absolute: 0.4 10*3/uL (ref 0.1–1.0)
Monocytes Relative: 6.7 % (ref 3.0–12.0)
Neutro Abs: 2.7 10*3/uL (ref 1.4–7.7)
Neutrophils Relative %: 48.2 % (ref 43.0–77.0)
Platelets: 185 10*3/uL (ref 150.0–400.0)
RBC: 4.79 Mil/uL (ref 3.87–5.11)
RDW: 14.5 % (ref 11.5–15.5)
WBC: 5.6 10*3/uL (ref 4.0–10.5)

## 2024-01-25 LAB — LIPID PANEL
Cholesterol: 194 mg/dL (ref 0–200)
HDL: 81.5 mg/dL (ref >39.00)
LDL Cholesterol: 85 mg/dL (ref 0–99)
NonHDL: 112.19
Total CHOL/HDL Ratio: 2
Triglycerides: 136 mg/dL (ref 0.0–149.0)
VLDL: 27.2 mg/dL (ref 0.0–40.0)

## 2024-01-25 LAB — B12 AND FOLATE PANEL
Folate: 9.7 ng/mL (ref >5.9)
Vitamin B-12: 398 pg/mL (ref 211–911)

## 2024-01-25 LAB — TSH: TSH: 1.88 u[IU]/mL (ref 0.35–5.50)

## 2024-01-25 LAB — VITAMIN D 25 HYDROXY (VIT D DEFICIENCY, FRACTURES): VITD: 31.84 ng/mL (ref 30.00–100.00)

## 2024-01-25 LAB — HEMOGLOBIN A1C: Hgb A1c MFr Bld: 5.6 % (ref 4.6–6.5)

## 2024-01-25 LAB — HIV ANTIBODY (ROUTINE TESTING W REFLEX): HIV 1&2 Ab, 4th Generation: NONREACTIVE

## 2024-01-25 NOTE — Assessment & Plan Note (Signed)
 Doing well on PRN ambien . No new concerns. Refill needed.  UDS/contract today

## 2024-01-25 NOTE — Assessment & Plan Note (Signed)
Asymptomatic.  Updating labs today.  Discussed low-carb diet, portion control, exercise, and weight management.  

## 2024-01-25 NOTE — Assessment & Plan Note (Signed)
 Labs today

## 2024-01-25 NOTE — Assessment & Plan Note (Signed)
 History of H. Pylori with recent onset of epigastric discomfort and decreased appetite. Currently taking occasional PPI, so we cannot get accurate H. pylori testing today.  -Refer to GI for possible endoscopy and H. Pylori testing. She is going to call her previous GI provider for an appointment.  -Continue supportive measures and PPI/H2

## 2024-01-25 NOTE — Assessment & Plan Note (Signed)
-  Reviewed most recent lipid panel -Medication management: continue simvastatin and Omega-3 -Repeat CMP and lipid panel today -Diet low in saturated fat -Regular exercise - at least 30 minutes, 5 times per week  

## 2024-01-25 NOTE — Assessment & Plan Note (Signed)
 Rechecking labs today.

## 2024-01-25 NOTE — Assessment & Plan Note (Signed)
 Increased anxiety symptoms despite current treatment with Paxil  30mg . Possible triggers include daughter's bipolar disorder, work stress, and travel. -Increase Paxil  to 40mg  daily. -Provide Hydroxyzine  for as-needed use for acute anxiety episodes.

## 2024-01-26 LAB — DRUG MONITORING PANEL 376104, URINE
Amphetamines: NEGATIVE ng/mL (ref <500)
Barbiturates: NEGATIVE ng/mL (ref <300)
Benzodiazepines: NEGATIVE ng/mL (ref <100)
Cocaine Metabolite: NEGATIVE ng/mL (ref <150)
Desmethyltramadol: NEGATIVE ng/mL (ref <100)
Opiates: NEGATIVE ng/mL (ref <100)
Oxycodone: NEGATIVE ng/mL (ref <100)
Tramadol: NEGATIVE ng/mL (ref <100)

## 2024-01-26 LAB — DM TEMPLATE

## 2024-01-28 ENCOUNTER — Encounter: Payer: Self-pay | Admitting: Family Medicine

## 2024-03-03 ENCOUNTER — Encounter: Payer: Self-pay | Admitting: Family Medicine

## 2024-03-04 ENCOUNTER — Telehealth: Payer: Self-pay

## 2024-03-04 NOTE — Telephone Encounter (Signed)
 Patient's Cpap machine has died. She hasn't been seen since 2023. Not sure if she would need a new appointment to get a new cpap machine. She can be reached at [phone]

## 2024-03-05 NOTE — Telephone Encounter (Signed)
 Appointment made for Dr Wynona Neat to establish care. Not seen since 2023.

## 2024-03-05 NOTE — Telephone Encounter (Signed)
 Patient would need an office visit I believe since they haven been seen since 2023

## 2024-03-12 ENCOUNTER — Ambulatory Visit: Admitting: Pulmonary Disease

## 2024-03-12 ENCOUNTER — Encounter: Payer: Self-pay | Admitting: Pulmonary Disease

## 2024-03-12 VITALS — BP 120/89 | HR 88 | Temp 98.1°F | Ht 66.0 in | Wt 195.0 lb

## 2024-03-12 DIAGNOSIS — G4733 Obstructive sleep apnea (adult) (pediatric): Secondary | ICD-10-CM

## 2024-03-12 NOTE — Progress Notes (Signed)
 Colleen Glass    [id number]    09/04/1971  Primary Care Physician:Beck, Lollie Marrow, NP  Referring Physician: Clayborne Dana, NP [address] [place],  [place] [address]  Chief complaint:   Patient not working for the last week to week and a half  HPI:  Patient with mild sleep apnea, uses CPAP  Machine became dysfunctional in the last week to week and a half  Appointment was scheduled for follow-up  Feels the appointment is a waste of the time because she could have taken a different route rather than come to the office  The fix is to contact [facility] to try and see whether the machine is serviceable   Outpatient Encounter Medications as of 03/12/2024  Medication Sig   COLLAGEN PO Take 1 Scoop by mouth daily. Mix with fluids   hydrOXYzine (ATARAX) 10 MG tablet Take 1-2 tablets (10-20 mg total) by mouth 3 (three) times daily as needed.   PARoxetine (PAXIL) 40 MG tablet Take 1 tablet (40 mg total) by mouth every morning.   simvastatin (ZOCOR) 40 MG tablet TAKE 1 TABLET BY MOUTH EVERYDAY AT BEDTIME (Patient taking differently: Take 40 mg by mouth daily at 6 PM.)   zolpidem (AMBIEN) 5 MG tablet Take 1 tablet (5 mg total) by mouth at bedtime as needed. for sleep   valACYclovir (VALTREX) 500 MG tablet Take 1 tablet (500 mg total) by mouth 2 (two) times daily as needed. Take 1 tablet twice a day for 3 days at the onset of symptoms (Patient not taking: Reported on 03/12/2024)   No facility-administered encounter medications on file as of 03/12/2024.    Allergies as of 03/12/2024 - Review Complete 03/12/2024  Allergen Reaction Noted   Other Itching 06/17/2019   Penicillins Rash 03/16/2015   Sulfa antibiotics Hives and Rash 03/16/2015    Past Medical History:  Diagnosis Date   Anxiety and depression    Class 1 obesity without serious comorbidity with body mass index (BMI) of 33.0 to 33.9 in adult 07/14/2022   Colon polyps 02/25/2014   Epigastric pain  10/04/2022   Gastric erosions 11/19/2013   H. pylori infection 10/04/2022   History of colon polyps 10/04/2022   Hyperlipidemia    Insomnia 10/25/2012   Mixed hyperlipidemia 07/14/2022   OSA (obstructive sleep apnea) 03/03/2022   Pain in joints 10/25/2012   Prediabetes 07/14/2022   Preventative health care 05/22/2014   Right bundle branch block    S/P hysterectomy 07/02/2015   Formatting of this note might be different from the original. 2009   Seasonal allergies 10/25/2012   Sinusitis 12/26/2012   Last Assessment & Plan: Formatting of this note might be different from the original. Flu test neg. Humidify bedroom at night. Guaifenesin 600mg  bid with 8 oz water prn to help loosen secretions. Rest and increase fluids. Start zithromax x 10 days. Pt has ssx of OSA.  Have suggested that she have a sleep study to evaluate this, but she refuses. Will need to cut back on nasal decongested.   Snoring 10/25/2012   Last Assessment & Plan: Formatting of this note might be different from the original. Have suggested that she go for a sleep study -- but she has deferred.  Husband is encouraging her to go ahead with this.   Vitamin D deficiency     Past Surgical History:  Procedure Laterality Date   ABDOMINAL HYSTERECTOMY     Cyst removed form  R ovaries      left knee athroscopy      Family History  Problem Relation Age of Onset   Hypertension Mother    Hyperlipidemia Mother    Hypertension Father    Hyperlipidemia Father    Hypertension Maternal Grandmother     Social History   Socioeconomic History   Marital status: Divorced    Spouse name: Not on file   Number of children: Not on file   Years of education: Not on file   Highest education level: Not on file  Occupational History   Not on file  Tobacco Use   Smoking status: Some Days    Types: Cigarettes   Smokeless tobacco: Never   Tobacco comments:    Smoking 1-2 cigarettes per day.  06/06/22 hfb  Vaping Use   Vaping status:  Never Used  Substance and Sexual Activity   Alcohol use: Yes   Drug use: Yes    Types: Other-see comments    Comment: CBD gummies to help with sleep   Sexual activity: Not Currently  Other Topics Concern   Not on file  Social History Narrative   Not on file   Social Drivers of Health   Financial Resource Strain: Not on file  Food Insecurity: Not on file  Transportation Needs: Not on file  Physical Activity: Not on file  Stress: Not on file  Social Connections: Unknown (05/01/2022)   Received from [facility]   Social Network    Social Network: Not on file  Intimate Partner Violence: Unknown (03/23/2022)   [facility] from [facility]   HITS    Physically Hurt: Not on file    Insult or Talk Down To: Not on file    Threaten Physical Harm: Not on file    Scream or Curse: Not on file    Review of Systems  Vitals:   03/12/24 0845  BP: 120/89  Pulse: 88  Temp: 98.1 F (36.7 C)  SpO2: 96%     Physical Exam  Not performed   Data Reviewed: Fernande Boyden shows an AHI of 0.7 Excellent compliance AutoSet 5-15  Assessment:  Obstructive sleep apnea  Dysfunctional machine  Plan/Recommendations:  Contact [facility] to see if machine is serviceable   Virl Diamond MD [facility] 03/12/2024, 9:05 AM  CC: Clayborne Dana, NP

## 2024-03-12 NOTE — Patient Instructions (Signed)
 Contact [facility] to take a look at the machine to assess if it can be fixed  Insurances would usually not pay for machine unless it is more than 55 years old or confirmed to be dysfunctional   Follow-up in about 3 months

## 2024-03-13 ENCOUNTER — Encounter: Payer: Self-pay | Admitting: Pulmonary Disease

## 2024-03-13 DIAGNOSIS — G4733 Obstructive sleep apnea (adult) (pediatric): Secondary | ICD-10-CM

## 2024-03-15 ENCOUNTER — Encounter: Payer: Self-pay | Admitting: Family Medicine

## 2024-03-15 DIAGNOSIS — Z79899 Other long term (current) drug therapy: Secondary | ICD-10-CM

## 2024-03-15 DIAGNOSIS — G4700 Insomnia, unspecified: Secondary | ICD-10-CM

## 2024-03-17 MED ORDER — ZOLPIDEM TARTRATE 5 MG PO TABS
5.0000 mg | ORAL_TABLET | Freq: Every evening | ORAL | 0 refills | Status: AC | PRN
Start: 2024-03-17 — End: ?

## 2024-03-20 ENCOUNTER — Ambulatory Visit: Payer: BC Managed Care – PPO | Admitting: Family Medicine

## 2024-04-09 NOTE — Progress Notes (Deleted)
 New Patient Evaluation and Consultation  Referring Provider: Everlina Hock, NP PCP: Everlina Hock, NP Date of Service: 04/10/2024  SUBJECTIVE Chief Complaint: No chief complaint on file.  History of Present Illness: Colleen Glass is a 53 y.o. 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    Started with urinary leakage with coughing/sneezing*** Increased incontinence with urgency this year  ***Review of records significant for: ***ascending aortic aneurysm, pre-DM, RBBB  Urinary Symptoms: {urine leakage?:24754} Leaks *** time(s) per {days/wks/mos/yrs:310907}.  Pad use: {NUMBERS 1-10:18281} {pad option:24752} per day.   Patient {ACTION; IS/IS BJY:78295621} bothered by UI symptoms.  Day time voids ***.  Nocturia: *** times per night to void with OSA on CPAP and insomnia Voiding dysfunction:  {empties:24755} bladder well.  Patient {DOES NOT does:27190::"does not"} use a catheter to empty bladder.  When urinating, patient feels {urine symptoms:24756} Drinks: *** per day  UTIs: {NUMBERS 1-10:18281} UTI's in the last year.   {ACTIONS;DENIES/REPORTS:21021675::"Denies"} history of {urologic concerns:24757} No results found for the last 90 days.   Pelvic Organ Prolapse Symptoms:                  Patient {denies/ admits to:24761} a feeling of a bulge the vaginal area. It has been present for {NUMBER 1-10:22536} {days/wks/mos/yrs:310907}.  Patient {denies/ admits to:24761} seeing a bulge.  This bulge {ACTION; IS/IS HYQ:65784696} bothersome.  Bowel Symptom: Bowel movements: *** time(s) per {Time; day/week/month:13537} Stool consistency: {stool consistency:24758} Straining: {yes/no:19897}.  Splinting: {yes/no:19897}.  Incomplete evacuation: {yes/no:19897}.  Patient {denies/ admits to:24761} accidental bowel leakage / fecal incontinence  Occurs: *** time(s) per {Time; day/week/month:13537}  Consistency  with leakage: {stool consistency:24758} Bowel regimen: {bowel regimen:24759} Last colonoscopy: Date ***, Results *** [facility] Colonoscopy          Upcoming     Colonoscopy (Every 5 Years) Next due on 08/27/2024    08/28/2019  [facility] COLONOSCOPY   Only the first 1 history entries have been loaded, but more history exists.                Sexual Function Sexually active: {yes/no:19897}.  Sexual orientation: {Sexual Orientation:[phone]} Pain with sex: {pain with sex:24762}  Pelvic Pain {denies/ admits to:24761} pelvic pain Location: *** Pain occurs: *** Prior pain treatment: *** Improved by: *** Worsened by: ***   Past Medical History:  Past Medical History:  Diagnosis Date   Anxiety and depression    Class 1 obesity without serious comorbidity with body mass index (BMI) of 33.0 to 33.9 in adult 07/14/2022   Colon polyps 02/25/2014   Epigastric pain 10/04/2022   Gastric erosions 11/19/2013   H. pylori infection 10/04/2022   History of colon polyps 10/04/2022   Hyperlipidemia    Insomnia 10/25/2012   Mixed hyperlipidemia 07/14/2022   OSA (obstructive sleep apnea) 03/03/2022   Pain in joints 10/25/2012   Prediabetes 07/14/2022   Preventative health care 05/22/2014   Right bundle branch block    S/P hysterectomy 07/02/2015   Formatting of this note might be different from the original. 2009   Seasonal allergies 10/25/2012   Sinusitis 12/26/2012   Last Assessment & Plan: Formatting of this note might be different from the original. Flu test neg. Humidify bedroom at night. Guaifenesin 600mg  bid with 8 oz water prn to help loosen secretions. Rest and increase fluids. Start zithromax x 10 days. Pt has ssx of OSA.  Have suggested that she have a sleep study to evaluate this,  but she refuses. Will need to cut back on nasal decongested.   Snoring 10/25/2012   Last Assessment & Plan: Formatting of this note might be different from the original. Have suggested that she go for a  sleep study -- but she has deferred.  Husband is encouraging her to go ahead with this.   Vitamin D  deficiency      Past Surgical History:   Past Surgical History:  Procedure Laterality Date   ABDOMINAL HYSTERECTOMY     Cyst removed form R ovaries      left knee athroscopy       Past OB/GYN History: OB History  No obstetric history on file.    Vaginal deliveries: ***,  Forceps/ Vacuum deliveries: ***, Cesarean section: *** Menopausal: {menopausal:24763} Contraception: ***. Last pap smear was ***.  Any history of abnormal pap smears: {yes/no:19897}. No results found for: "DIAGPAP", "HPVHIGH", "ADEQPAP"  Medications: Patient has a current medication list which includes the following prescription(s): collagen-vitamin c-biotin, hydroxyzine , paroxetine , simvastatin , valacyclovir , and zolpidem .   Allergies: Patient is allergic to other, penicillins, and sulfa antibiotics.   Social History:  Social History   Tobacco Use   Smoking status: Some Days    Types: Cigarettes   Smokeless tobacco: Never   Tobacco comments:    Smoking 1-2 cigarettes per day.  06/06/22 hfb  Vaping Use   Vaping status: Never Used  Substance Use Topics   Alcohol use: Yes   Drug use: Yes    Types: Other-see comments    Comment: CBD gummies to help with sleep    Relationship status: {relationship status:24764} Patient lives with ***.   Patient {ACTION; IS/IS ZOX:09604540} employed ***. Regular exercise: {Yes/No:304960894} History of abuse: {Yes/No:304960894}  Family History:   Family History  Problem Relation Age of Onset   Hypertension Mother    Hyperlipidemia Mother    Hypertension Father    Hyperlipidemia Father    Hypertension Maternal Grandmother      Review of Systems: ROS   OBJECTIVE Physical Exam: There were no vitals filed for this visit.  Physical Exam   GU / Detailed Urogynecologic Evaluation:  Pelvic Exam: Normal external female genitalia; Bartholin's and Skene's glands  normal in appearance; urethral meatus normal in appearance, no urethral masses or discharge.   CST: {gen negative/positive:315881}  Reflexes: bulbocavernosis {DESC; PRESENT/NOT PRESENT:21021351}, anocutaneous {DESC; PRESENT/NOT PRESENT:21021351} ***bilaterally.  Speculum exam reveals normal vaginal mucosa {With/Without:20273} atrophy. Cervix {exam; gyn cervix:30847}. Uterus {exam; pelvic uterus:30849}. Adnexa {exam; adnexa:12223}.    s/p hysterectomy: Speculum exam reveals normal vaginal mucosa {With/Without:20273}  atrophy and normal vaginal cuff.  Adnexa {exam; adnexa:12223}.    With apex supported, anterior compartment defect was {reduced:24765}  Pelvic floor strength {Roman # I-V:19040}/V, puborectalis {Roman # I-V:19040}/V external anal sphincter {Roman # I-V:19040}/V  Pelvic floor musculature: Right levator {Tender/Non-tender:20250}, Right obturator {Tender/Non-tender:20250}, Left levator {Tender/Non-tender:20250}, Left obturator {Tender/Non-tender:20250}  POP-Q:   POP-Q                                               Aa                                               Ba  C                                                Gh                                               Pb                                               tvl                                                Ap                                               Bp                                                 D      Rectal Exam:  Normal sphincter tone, {rectocele:24766} distal rectocele, enterocoele {DESC; PRESENT/NOT PRESENT:21021351}, no rectal masses, {sign of:24767} dyssynergia when asking the patient to bear down.  Post-Void Residual (PVR) by Bladder Scan: In order to evaluate bladder emptying, we discussed obtaining a postvoid residual and patient agreed to this procedure.  Procedure: The ultrasound unit was placed on the patient's abdomen in the suprapubic  region after the patient had voided.      Laboratory Results: No results found for: "COLORU", "CLARITYU", "GLUCOSEUR", "BILIRUBINUR", "KETONESU", "SPECGRAV", "RBCUR", "PHUR", "PROTEINUR", "UROBILINOGEN", "LEUKOCYTESUR"  Lab Results  Component Value Date   CREATININE 0.92 01/24/2024   CREATININE 0.99 09/26/2023   CREATININE 0.93 01/26/2023    Lab Results  Component Value Date   HGBA1C 5.6 01/24/2024    Lab Results  Component Value Date   HGB 15.4 (H) 01/24/2024     ASSESSMENT AND PLAN Ms. Quayle is a 53 y.o. with: No diagnosis found.  There are no diagnoses linked to this encounter.   Darlene Ehlers, MD

## 2024-04-10 ENCOUNTER — Ambulatory Visit: Payer: BC Managed Care – PPO | Admitting: Obstetrics

## 2024-04-29 ENCOUNTER — Ambulatory Visit: Admitting: Cardiology

## 2024-05-20 ENCOUNTER — Other Ambulatory Visit: Payer: Self-pay | Admitting: Family Medicine

## 2024-05-20 DIAGNOSIS — E782 Mixed hyperlipidemia: Secondary | ICD-10-CM

## 2024-06-13 ENCOUNTER — Ambulatory Visit: Admitting: Obstetrics

## 2024-07-15 ENCOUNTER — Ambulatory Visit: Attending: Cardiology | Admitting: Cardiology

## 2024-07-19 ENCOUNTER — Other Ambulatory Visit: Payer: Self-pay | Admitting: Family Medicine

## 2024-07-19 DIAGNOSIS — F32A Depression, unspecified: Secondary | ICD-10-CM

## 2024-08-21 ENCOUNTER — Encounter: Payer: Self-pay | Admitting: Family Medicine

## 2024-09-18 ENCOUNTER — Other Ambulatory Visit: Payer: Self-pay | Admitting: Family Medicine

## 2024-09-18 DIAGNOSIS — F32A Depression, unspecified: Secondary | ICD-10-CM

## 2024-09-18 NOTE — Telephone Encounter (Signed)
 Pt overdue for appt. See below. Appt not scheduled. Rx denied.   Randine Carr-Marcel to [facility] (supporting Waddell KATHEE Mon, NP) TC    08/24/24  9:02 AM I may have missed it,  thanks and I will schedule McCracken, Jade L, CMA to Joya Willmott     08/22/24  8:28 AM Paxil  was last sent on 07/21/2024 for a one month supply. You are overdue for a visit with Waddell, please call our office to schedule. [phone].  Last read by Randine Lennert at 9:01AM on 08/24/2024. Randine Carr-Marcel to [facility] (supporting Waddell KATHEE Mon, NP) TC    08/21/24  7:50 PM Hi, Do I have any refills in my Paxil  ? Thanks

## 2024-09-20 ENCOUNTER — Other Ambulatory Visit: Payer: Self-pay | Admitting: Family Medicine

## 2024-09-20 DIAGNOSIS — F419 Anxiety disorder, unspecified: Secondary | ICD-10-CM

## 2024-11-03 ENCOUNTER — Encounter: Payer: Self-pay | Admitting: Family Medicine

## 2024-11-06 ENCOUNTER — Other Ambulatory Visit (HOSPITAL_BASED_OUTPATIENT_CLINIC_OR_DEPARTMENT_OTHER): Payer: Self-pay | Admitting: Family Medicine

## 2024-11-06 DIAGNOSIS — Z1231 Encounter for screening mammogram for malignant neoplasm of breast: Secondary | ICD-10-CM

## 2024-12-22 ENCOUNTER — Inpatient Hospital Stay (HOSPITAL_BASED_OUTPATIENT_CLINIC_OR_DEPARTMENT_OTHER): Admission: RE | Admit: 2024-12-22 | Source: Ambulatory Visit
